# Patient Record
Sex: Female | Born: 1935 | Race: White | Hispanic: No | Smoking: Former smoker
Health system: Southern US, Community
[De-identification: ages and names within clinical notes are randomized; demographics above are authoritative.]

## PROBLEM LIST (undated history)

## (undated) DIAGNOSIS — I2510 Atherosclerotic heart disease of native coronary artery without angina pectoris: Secondary | ICD-10-CM

## (undated) DIAGNOSIS — I10 Essential (primary) hypertension: Secondary | ICD-10-CM

## (undated) DIAGNOSIS — I255 Ischemic cardiomyopathy: Secondary | ICD-10-CM

## (undated) DIAGNOSIS — I480 Paroxysmal atrial fibrillation: Secondary | ICD-10-CM

## (undated) DIAGNOSIS — K922 Gastrointestinal hemorrhage, unspecified: Secondary | ICD-10-CM

## (undated) DIAGNOSIS — C189 Malignant neoplasm of colon, unspecified: Secondary | ICD-10-CM

## (undated) DIAGNOSIS — D649 Anemia, unspecified: Secondary | ICD-10-CM

## (undated) DIAGNOSIS — N184 Chronic kidney disease, stage 4 (severe): Secondary | ICD-10-CM

## (undated) DIAGNOSIS — I5022 Chronic systolic (congestive) heart failure: Secondary | ICD-10-CM

## (undated) HISTORY — DX: Atherosclerotic heart disease of native coronary artery without angina pectoris: I25.10

## (undated) HISTORY — DX: Chronic kidney disease, stage 4 (severe): N18.4

## (undated) HISTORY — PX: COLECTOMY: SHX59

## (undated) HISTORY — DX: Paroxysmal atrial fibrillation: I48.0

## (undated) HISTORY — DX: Chronic systolic (congestive) heart failure: I50.22

---

## 2002-12-15 ENCOUNTER — Other Ambulatory Visit: Payer: Self-pay

## 2007-07-18 ENCOUNTER — Ambulatory Visit: Payer: Self-pay | Admitting: Gastroenterology

## 2007-08-16 ENCOUNTER — Other Ambulatory Visit: Payer: Self-pay

## 2007-08-16 ENCOUNTER — Ambulatory Visit: Payer: Self-pay | Admitting: Surgery

## 2007-08-22 ENCOUNTER — Inpatient Hospital Stay: Payer: Self-pay | Admitting: Surgery

## 2007-08-27 ENCOUNTER — Ambulatory Visit: Payer: Self-pay | Admitting: Oncology

## 2007-09-16 ENCOUNTER — Ambulatory Visit: Payer: Self-pay | Admitting: Oncology

## 2007-09-27 ENCOUNTER — Ambulatory Visit: Payer: Self-pay | Admitting: Oncology

## 2007-10-27 ENCOUNTER — Ambulatory Visit: Payer: Self-pay | Admitting: Oncology

## 2007-10-28 ENCOUNTER — Ambulatory Visit: Payer: Self-pay | Admitting: Oncology

## 2007-11-27 ENCOUNTER — Ambulatory Visit: Payer: Self-pay | Admitting: Oncology

## 2007-12-27 ENCOUNTER — Ambulatory Visit: Payer: Self-pay | Admitting: Oncology

## 2008-09-03 ENCOUNTER — Ambulatory Visit: Payer: Self-pay | Admitting: Gastroenterology

## 2009-05-26 ENCOUNTER — Ambulatory Visit: Payer: Self-pay | Admitting: Oncology

## 2009-06-14 ENCOUNTER — Ambulatory Visit: Payer: Self-pay | Admitting: Oncology

## 2009-06-26 ENCOUNTER — Ambulatory Visit: Payer: Self-pay | Admitting: Oncology

## 2009-07-26 ENCOUNTER — Ambulatory Visit: Payer: Self-pay | Admitting: Oncology

## 2009-08-16 LAB — CEA: CEA: 2.6 ng/mL

## 2009-08-26 ENCOUNTER — Ambulatory Visit: Payer: Self-pay | Admitting: Oncology

## 2009-09-26 ENCOUNTER — Ambulatory Visit: Payer: Self-pay | Admitting: Oncology

## 2009-10-02 ENCOUNTER — Ambulatory Visit: Payer: Self-pay | Admitting: Oncology

## 2009-10-26 ENCOUNTER — Ambulatory Visit: Payer: Self-pay | Admitting: Oncology

## 2009-12-12 ENCOUNTER — Ambulatory Visit: Payer: Self-pay | Admitting: Oncology

## 2009-12-14 LAB — CEA: CEA: 3.3 ng/mL (ref 0.0–4.7)

## 2009-12-26 ENCOUNTER — Ambulatory Visit: Payer: Self-pay | Admitting: Oncology

## 2010-04-11 ENCOUNTER — Inpatient Hospital Stay: Payer: Self-pay | Admitting: Internal Medicine

## 2010-07-07 ENCOUNTER — Ambulatory Visit: Payer: Self-pay | Admitting: Gastroenterology

## 2010-12-03 ENCOUNTER — Ambulatory Visit: Payer: Self-pay | Admitting: Oncology

## 2010-12-27 ENCOUNTER — Ambulatory Visit: Payer: Self-pay | Admitting: Oncology

## 2011-03-11 ENCOUNTER — Ambulatory Visit: Payer: Self-pay | Admitting: Oncology

## 2011-03-11 LAB — CBC CANCER CENTER
Basophil #: 0 x10 3/mm (ref 0.0–0.1)
Basophil %: 0.2 %
Eosinophil #: 0.2 x10 3/mm (ref 0.0–0.7)
HCT: 32.7 % — ABNORMAL LOW (ref 35.0–47.0)
HGB: 10.6 g/dL — ABNORMAL LOW (ref 12.0–16.0)
Lymphocyte %: 29.2 %
Monocyte %: 13.4 %
RBC: 3.9 10*6/uL (ref 3.80–5.20)
RDW: 13.3 % (ref 11.5–14.5)
WBC: 7.6 x10 3/mm (ref 3.6–11.0)

## 2011-03-11 LAB — IRON AND TIBC
Iron Bind.Cap.(Total): 243 ug/dL — ABNORMAL LOW (ref 250–450)
Iron Saturation: 31 %
Iron: 76 ug/dL (ref 50–170)

## 2011-03-11 LAB — FERRITIN: Ferritin (ARMC): 198 ng/mL (ref 8–388)

## 2011-03-12 LAB — CEA: CEA: 3 ng/mL (ref 0.0–4.7)

## 2011-03-27 ENCOUNTER — Ambulatory Visit: Payer: Self-pay | Admitting: Oncology

## 2011-06-10 ENCOUNTER — Ambulatory Visit: Payer: Self-pay | Admitting: Oncology

## 2011-06-10 LAB — CBC CANCER CENTER
Basophil %: 0.7 %
Eosinophil #: 0.2 x10 3/mm (ref 0.0–0.7)
Eosinophil %: 2.2 %
HCT: 33.2 % — ABNORMAL LOW (ref 35.0–47.0)
MCH: 27.6 pg (ref 26.0–34.0)

## 2011-06-27 ENCOUNTER — Ambulatory Visit: Payer: Self-pay | Admitting: Oncology

## 2011-07-18 ENCOUNTER — Inpatient Hospital Stay: Payer: Self-pay | Admitting: Internal Medicine

## 2011-07-18 LAB — COMPREHENSIVE METABOLIC PANEL
Albumin: 3 g/dL — ABNORMAL LOW (ref 3.4–5.0)
Alkaline Phosphatase: 68 U/L (ref 50–136)
Bilirubin,Total: 0.3 mg/dL (ref 0.2–1.0)
Co2: 24 mmol/L (ref 21–32)
Creatinine: 1.1 mg/dL (ref 0.60–1.30)
Glucose: 106 mg/dL — ABNORMAL HIGH (ref 65–99)
SGPT (ALT): 22 U/L
Sodium: 143 mmol/L (ref 136–145)

## 2011-07-18 LAB — URINALYSIS, COMPLETE
Bacteria: NONE SEEN
Bilirubin,UR: NEGATIVE
Protein: NEGATIVE
Specific Gravity: 1.009 (ref 1.003–1.030)

## 2011-07-18 LAB — CBC: Platelet: 170 10*3/uL (ref 150–440)

## 2011-07-18 LAB — PROTIME-INR: Prothrombin Time: 14.3 secs (ref 11.5–14.7)

## 2011-07-18 LAB — LIPASE, BLOOD: Lipase: 157 U/L (ref 73–393)

## 2011-07-18 LAB — HEMATOCRIT: HCT: 25.4 % — ABNORMAL LOW (ref 35.0–47.0)

## 2011-07-18 LAB — HEMOGLOBIN: HGB: 8.4 g/dL — ABNORMAL LOW (ref 12.0–16.0)

## 2011-07-19 LAB — BASIC METABOLIC PANEL
BUN: 13 mg/dL (ref 7–18)
Calcium, Total: 7.8 mg/dL — ABNORMAL LOW (ref 8.5–10.1)
Co2: 25 mmol/L (ref 21–32)
EGFR (African American): >60
Glucose: 74 mg/dL (ref 65–99)
Sodium: 145 mmol/L (ref 136–145)

## 2011-07-19 LAB — CBC WITH DIFFERENTIAL/PLATELET
Basophil #: 0 10*3/uL (ref 0.0–0.1)
Basophil %: 0.5 %
Eosinophil #: 0.1 10*3/uL (ref 0.0–0.7)
Eosinophil %: 2.4 %
HCT: 23.2 % — ABNORMAL LOW (ref 35.0–47.0)
HGB: 7.6 g/dL — ABNORMAL LOW (ref 12.0–16.0)
Lymphocyte #: 1.6 10*3/uL (ref 1.0–3.6)
MCH: 27.6 pg (ref 26.0–34.0)
MCHC: 32.5 g/dL (ref 32.0–36.0)
MCV: 85 fL (ref 80–100)
Neutrophil %: 53.6 %
Platelet: 154 10*3/uL (ref 150–440)
RBC: 2.74 10*6/uL — ABNORMAL LOW (ref 3.80–5.20)
WBC: 4.7 10*3/uL (ref 3.6–11.0)

## 2011-10-14 ENCOUNTER — Ambulatory Visit: Payer: Self-pay | Admitting: Oncology

## 2011-10-14 LAB — IRON AND TIBC
Iron Bind.Cap.(Total): 243 ug/dL — ABNORMAL LOW (ref 250–450)
Unbound Iron-Bind.Cap.: 163 ug/dL

## 2011-10-14 LAB — CBC CANCER CENTER
Basophil #: 0.1 x10 3/mm (ref 0.0–0.1)
Lymphocyte #: 2.6 x10 3/mm (ref 1.0–3.6)
MCH: 27.7 pg (ref 26.0–34.0)
MCV: 85 fL (ref 80–100)
Monocyte #: 0.8 x10 3/mm (ref 0.2–0.9)
Platelet: 189 x10 3/mm (ref 150–440)
RDW: 13 % (ref 11.5–14.5)

## 2011-10-14 LAB — FERRITIN: Ferritin (ARMC): 262 ng/mL (ref 8–388)

## 2011-10-27 ENCOUNTER — Ambulatory Visit: Payer: Self-pay | Admitting: Oncology

## 2012-01-16 ENCOUNTER — Inpatient Hospital Stay: Payer: Self-pay | Admitting: Student

## 2012-01-16 LAB — CK TOTAL AND CKMB (NOT AT ARMC)
CK, Total: 75 U/L (ref 21–215)
CK-MB: <0.5 ng/mL — ABNORMAL LOW (ref 0.5–3.6)

## 2012-01-16 LAB — COMPREHENSIVE METABOLIC PANEL
Albumin: 3.6 g/dL (ref 3.4–5.0)
Alkaline Phosphatase: 77 U/L (ref 50–136)
Bilirubin,Total: 0.4 mg/dL (ref 0.2–1.0)
Creatinine: 0.98 mg/dL (ref 0.60–1.30)
EGFR (Non-African Amer.): 53 — ABNORMAL LOW
Glucose: 95 mg/dL (ref 65–99)
Osmolality: 282 (ref 275–301)
SGPT (ALT): 17 U/L (ref 12–78)
Sodium: 141 mmol/L (ref 136–145)

## 2012-01-16 LAB — TROPONIN I
Troponin-I: 0.08 ng/mL — ABNORMAL HIGH
Troponin-I: 0.19 ng/mL — ABNORMAL HIGH

## 2012-01-17 ENCOUNTER — Encounter (HOSPITAL_COMMUNITY)
Admission: AD | Disposition: A | Discharge status: Home / Self Care | Payer: Self-pay | Source: Other Acute Inpatient Hospital | Attending: Cardiovascular Disease

## 2012-01-17 ENCOUNTER — Encounter (HOSPITAL_COMMUNITY): Payer: Self-pay | Admitting: Cardiology

## 2012-01-17 ENCOUNTER — Inpatient Hospital Stay (HOSPITAL_COMMUNITY)
Admission: AD | Admit: 2012-01-17 | Discharge: 2012-02-03 | DRG: 248 | Disposition: A | Discharge status: Home / Self Care | Payer: Medicare Other | Source: Other Acute Inpatient Hospital | Attending: Cardiovascular Disease | Admitting: Cardiovascular Disease

## 2012-01-17 DIAGNOSIS — J189 Pneumonia, unspecified organism: Secondary | ICD-10-CM | POA: Diagnosis not present

## 2012-01-17 DIAGNOSIS — I509 Heart failure, unspecified: Secondary | ICD-10-CM | POA: Diagnosis present

## 2012-01-17 DIAGNOSIS — I213 ST elevation (STEMI) myocardial infarction of unspecified site: Secondary | ICD-10-CM | POA: Diagnosis present

## 2012-01-17 DIAGNOSIS — N390 Urinary tract infection, site not specified: Secondary | ICD-10-CM | POA: Diagnosis present

## 2012-01-17 DIAGNOSIS — C189 Malignant neoplasm of colon, unspecified: Secondary | ICD-10-CM | POA: Insufficient documentation

## 2012-01-17 DIAGNOSIS — I4891 Unspecified atrial fibrillation: Secondary | ICD-10-CM | POA: Diagnosis present

## 2012-01-17 DIAGNOSIS — G9340 Encephalopathy, unspecified: Secondary | ICD-10-CM | POA: Diagnosis not present

## 2012-01-17 DIAGNOSIS — R079 Chest pain, unspecified: Secondary | ICD-10-CM

## 2012-01-17 DIAGNOSIS — I2109 ST elevation (STEMI) myocardial infarction involving other coronary artery of anterior wall: Secondary | ICD-10-CM | POA: Diagnosis present

## 2012-01-17 DIAGNOSIS — I214 Non-ST elevation (NSTEMI) myocardial infarction: Principal | ICD-10-CM | POA: Diagnosis present

## 2012-01-17 DIAGNOSIS — I5041 Acute combined systolic (congestive) and diastolic (congestive) heart failure: Secondary | ICD-10-CM | POA: Diagnosis present

## 2012-01-17 DIAGNOSIS — A498 Other bacterial infections of unspecified site: Secondary | ICD-10-CM | POA: Diagnosis present

## 2012-01-17 DIAGNOSIS — I10 Essential (primary) hypertension: Secondary | ICD-10-CM | POA: Diagnosis present

## 2012-01-17 DIAGNOSIS — D649 Anemia, unspecified: Secondary | ICD-10-CM | POA: Diagnosis present

## 2012-01-17 DIAGNOSIS — E785 Hyperlipidemia, unspecified: Secondary | ICD-10-CM | POA: Diagnosis present

## 2012-01-17 DIAGNOSIS — J9600 Acute respiratory failure, unspecified whether with hypoxia or hypercapnia: Secondary | ICD-10-CM | POA: Diagnosis not present

## 2012-01-17 DIAGNOSIS — N179 Acute kidney failure, unspecified: Secondary | ICD-10-CM | POA: Diagnosis not present

## 2012-01-17 DIAGNOSIS — R579 Shock, unspecified: Secondary | ICD-10-CM

## 2012-01-17 DIAGNOSIS — I219 Acute myocardial infarction, unspecified: Secondary | ICD-10-CM

## 2012-01-17 DIAGNOSIS — E876 Hypokalemia: Secondary | ICD-10-CM | POA: Diagnosis not present

## 2012-01-17 DIAGNOSIS — E2749 Other adrenocortical insufficiency: Secondary | ICD-10-CM | POA: Diagnosis present

## 2012-01-17 DIAGNOSIS — I5021 Acute systolic (congestive) heart failure: Secondary | ICD-10-CM

## 2012-01-17 DIAGNOSIS — R0602 Shortness of breath: Secondary | ICD-10-CM

## 2012-01-17 DIAGNOSIS — A419 Sepsis, unspecified organism: Secondary | ICD-10-CM | POA: Diagnosis not present

## 2012-01-17 DIAGNOSIS — Z955 Presence of coronary angioplasty implant and graft: Secondary | ICD-10-CM

## 2012-01-17 DIAGNOSIS — K922 Gastrointestinal hemorrhage, unspecified: Secondary | ICD-10-CM | POA: Diagnosis present

## 2012-01-17 HISTORY — DX: Malignant neoplasm of colon, unspecified: C18.9

## 2012-01-17 HISTORY — DX: Gastrointestinal hemorrhage, unspecified: K92.2

## 2012-01-17 HISTORY — PX: LEFT HEART CATHETERIZATION WITH CORONARY ANGIOGRAM: SHX5451

## 2012-01-17 HISTORY — DX: Anemia, unspecified: D64.9

## 2012-01-17 HISTORY — DX: Ischemic cardiomyopathy: I25.5

## 2012-01-17 HISTORY — DX: Essential (primary) hypertension: I10

## 2012-01-17 LAB — LIPID PANEL
Cholesterol: 180 mg/dL (ref 0–200)
HDL Cholesterol: 39 mg/dL — ABNORMAL LOW (ref 40–60)
Ldl Cholesterol, Calc: 120 mg/dL — ABNORMAL HIGH (ref 0–100)
VLDL Cholesterol, Calc: 21 mg/dL (ref 5–40)

## 2012-01-17 LAB — BASIC METABOLIC PANEL
CO2: 21 mEq/L (ref 19–32)
Calcium: 8.5 mg/dL (ref 8.4–10.5)
Chloride: 102 mEq/L (ref 96–112)
Creatinine, Ser: 1.12 mg/dL — ABNORMAL HIGH (ref 0.50–1.10)
Glucose, Bld: 194 mg/dL — ABNORMAL HIGH (ref 70–99)

## 2012-01-17 LAB — URINALYSIS, ROUTINE W REFLEX MICROSCOPIC
Bilirubin Urine: NEGATIVE
Glucose, UA: NEGATIVE mg/dL
Ketones, ur: NEGATIVE mg/dL
Protein, ur: NEGATIVE mg/dL
pH: 5 (ref 5.0–8.0)

## 2012-01-17 LAB — CBC
Hemoglobin: 11 g/dL — ABNORMAL LOW (ref 12.0–15.0)
MCH: 27 pg (ref 26.0–34.0)
MCV: 83.5 fL (ref 78.0–100.0)
Platelets: 214 10*3/uL (ref 150–400)
RBC: 4.07 MIL/uL (ref 3.87–5.11)
WBC: 11.2 10*3/uL — ABNORMAL HIGH (ref 4.0–10.5)

## 2012-01-17 LAB — TROPONIN I: Troponin I: >20 ng/mL (ref <0.30)

## 2012-01-17 LAB — URINE MICROSCOPIC-ADD ON

## 2012-01-17 SURGERY — LEFT HEART CATHETERIZATION WITH CORONARY ANGIOGRAM
Anesthesia: LOCAL

## 2012-01-17 MED ORDER — CLOPIDOGREL BISULFATE 300 MG PO TABS
600.0000 mg | ORAL_TABLET | Freq: Once | ORAL | Status: AC
  Administered 2012-01-17: 600 mg via ORAL
  Filled 2012-01-17: qty 2

## 2012-01-17 MED ORDER — ATROPINE SULFATE 1 MG/ML IJ SOLN
INTRAMUSCULAR | Status: AC
  Filled 2012-01-17: qty 1

## 2012-01-17 MED ORDER — BIVALIRUDIN 250 MG IV SOLR
INTRAVENOUS | Status: AC
  Filled 2012-01-17: qty 250

## 2012-01-17 MED ORDER — PANTOPRAZOLE SODIUM 40 MG PO TBEC
40.0000 mg | DELAYED_RELEASE_TABLET | Freq: Every day | ORAL | Status: DC
  Administered 2012-01-17 – 2012-01-18 (×2): 40 mg via ORAL
  Filled 2012-01-17 (×2): qty 1

## 2012-01-17 MED ORDER — SODIUM CHLORIDE 0.9 % IV SOLN
1.0000 mL/kg/h | INTRAVENOUS | Status: AC
  Administered 2012-01-17: 1 mL/kg/h via INTRAVENOUS

## 2012-01-17 MED ORDER — CLOPIDOGREL BISULFATE 75 MG PO TABS
75.0000 mg | ORAL_TABLET | Freq: Every day | ORAL | Status: DC
  Administered 2012-01-18 – 2012-02-03 (×17): 75 mg via ORAL
  Filled 2012-01-17 (×19): qty 1

## 2012-01-17 MED ORDER — PROMETHAZINE HCL 25 MG/ML IJ SOLN
INTRAMUSCULAR | Status: AC
  Filled 2012-01-17: qty 1

## 2012-01-17 MED ORDER — EPTIFIBATIDE 75 MG/100ML IV SOLN
1.0000 ug/kg/min | INTRAVENOUS | Status: DC
  Administered 2012-01-17 – 2012-01-18 (×2): 1 ug/kg/min via INTRAVENOUS
  Filled 2012-01-17: qty 100

## 2012-01-17 MED ORDER — POTASSIUM CHLORIDE CRYS ER 20 MEQ PO TBCR
40.0000 meq | EXTENDED_RELEASE_TABLET | Freq: Once | ORAL | Status: AC
  Administered 2012-01-18: 40 meq via ORAL
  Filled 2012-01-17: qty 2

## 2012-01-17 MED ORDER — POTASSIUM CHLORIDE 10 MEQ/100ML IV SOLN
10.0000 meq | INTRAVENOUS | Status: AC
  Administered 2012-01-17 (×2): 10 meq via INTRAVENOUS
  Filled 2012-01-17: qty 200

## 2012-01-17 MED ORDER — ONDANSETRON HCL 4 MG/2ML IJ SOLN
4.0000 mg | Freq: Four times a day (QID) | INTRAMUSCULAR | Status: DC | PRN
  Administered 2012-01-17 – 2012-01-18 (×2): 4 mg via INTRAVENOUS
  Filled 2012-01-17 (×4): qty 2

## 2012-01-17 MED ORDER — PROMETHAZINE HCL 25 MG/ML IJ SOLN: 12.5000 mg | INTRAMUSCULAR | Status: DC

## 2012-01-17 MED ORDER — EPTIFIBATIDE 75 MG/100ML IV SOLN
INTRAVENOUS | Status: AC
  Filled 2012-01-17: qty 100

## 2012-01-17 MED ORDER — ACETAMINOPHEN 325 MG PO TABS
650.0000 mg | ORAL_TABLET | ORAL | Status: DC | PRN
  Administered 2012-01-18: 650 mg via ORAL
  Filled 2012-01-17: qty 2

## 2012-01-17 MED ORDER — METOPROLOL TARTRATE 25 MG PO TABS
25.0000 mg | ORAL_TABLET | Freq: Two times a day (BID) | ORAL | Status: DC
  Administered 2012-01-17 – 2012-01-18 (×2): 25 mg via ORAL
  Filled 2012-01-17 (×3): qty 1

## 2012-01-17 NOTE — CV Procedure (Addendum)
[facility] CARDIAC CATHETERIZATION AND PERCUTANEOUS CORONARY INTERVENTION REPORT  NAME:  [name] Green   MRN: [id number] DOB:  [birth year]   ADMIT DATE: 01/17/2012 Procedure Date: 01/17/2012   INTERVENTIONAL CARDIOLOGIST: Marykay Lex, M.D., MS PRIMARY CARE PROVIDER: No primary provider on file. PRIMARY CARDIOLOGIST: Dr. Milinda Antis, [facility]  PATIENT:  [name] Green is a 78 y.o. female the past history notable for colon cancer in remission, history of GI bleeds on as well as hypertension who admitted to [facility] on the evening of December 21 with dyspnea. This occurred while shopping. She then upon arriving back home this ride having symptoms of discomfort in her chest radiating to her shoulder associated with sweating. She was admitted to [facility] by the hospitalist service and was found to have mildly elevated troponins. She was admitted for presumed non-ST elevation MI. However this morning upon initial valuation she had recurrence of chest pain with an ECG demonstrating 1-2 mm ST elevations in the anterior septal/anterior lateral distribution involving all the precordial leads as well as leads 2 and aVF.  She was evaluated by Dr. Kary Kos who felt that she warranted emergent transfer to [facility] for Anterior STEMI.  He did discuss the risks benefits alternatives and indications of the cardiac catheterization procedure. She agreed to proceed prior to transport. She did get mildly hypotensive after demonstration of nitroglycerin sublingual, therefore she was started on low-dose dopamine for blood pressure assistance.  Due to history of gastrointestinal bleeding in the past, Dr. Kary Kos requested the use of bare-metal stents to avoid the need for long-term dual antiplatelet therapy. Upon arrival to [facility], she was brought directly to the catheterization lab where she continued to have nausea despite receiving 8 mg of Zofran at  [facility]. Her blood pressures were stable in the 110-115 mmHg range, with heart rates in the 110-120 beats per minute.  PRE-OPERATIVE DIAGNOSIS:    Anterior ST Elevation MI  PROCEDURES PERFORMED:    Left Heart Catheterization with Native Coronary Angiography  Left Ventriculography  Successful 2 site Percutaneous Coronary Intervention of mid and proximal LAD lesions using overlapping Veriflex Bare-Metal Stents (BMS) -- 2.5 mm x 20 mm mid - post dilated to 2.75  - 2.4mm, and 2.75 mm x 24 mm proximal -- postdilated to 3.0 mm  PROCEDURE: Consent: The procedure had been explained to the patient by Dr. Kary Kos while at [facility]. She had voice understanding and agree to proceed prior to transport.  Risks of procedure as well as the alternatives and risks of each were explained to the (patient/caregiver).  Consent for procedure obtained. and Unable to obtain consent because of emergent medical necessity.  PROCEDURE: The patient was brought to the 2nd Floor  Cardiac Catheterization Lab in the fasting state and prepped and draped in the usual sterile fashion for Right groin access. Sterile technique was used including antiseptics, cap, gloves, gown, hand hygiene, mask and sheet.  Skin prep: Chlorhexidine.  Time Out: Verified patient identification, verified procedure, site/side was marked, verified correct patient position, special equipment/implants available, medications/allergies/relevent history reviewed, required imaging and test results available.  Performed  Access: Right Common Femoral Artery; 6 Fr Sheath - fluoroscopically guided modified Seldinger technique. Diagnostic:  6 French catheters advanced over and exchanged over the J-wire  Right  Coronary Artery Angiography: JR 4  Left Coronary Artery Angiography: XB LAD 3.5  LV Hemodynamics (LV Gram): JR 4 (initial), angled pigtail (post PCI, for LV gram)  At the  completion of the procedure catheters were removed out of  the body over wire without complication. The sheath was sutured in place.  ANESTHESIA:   Local Lidocaine 18 ml SEDATION:  12.5 mg IV Phenergan MEDICATIONS:  Plavix 600 mg not administered due to Nausea/ Emesis pre, during & post procedure.  IV Pepcid 20mg ;   IV Lasix 20 mg  Dopamine drip weaned to off from 68mcg/min;  Omnipaque Contrast ~205 ml.  Anticoagulation: Angiomax Bolus with drip during PCI.  Anti-Platelet Agent: Integrelin Bolus - reduced rate; to be continued up to 6hr post 600mg  Plavix  Hemodynamics:  Central Aortic / Mean Pressures:  Initial -- 116/66 mmHg; 87 mmHg on 27mcg/min Dopamine; HR 115-120 bpm  Final (Post PCI)-- 121/61 mmHg; 23-28 mmHg; HR 100-105  Left Ventricular Pressures / EDP:   Initial -- 116/53mmHg; 25 mmHg  Final (Post PCI) -- 114/27 mmHg; 30 mmHg --> IV Lasix 20mg  given.  Coronary Anatomy:  Left Main: Calcified ~20% proximal stenosis; bifurcates into LAD & Circumflex with very small Ramus Intermedius. LAD: Large caliber vessel with an early SP1/Diag 1 (D1) branch point that has a ~90% focal lesion at the branch point, followed by a ~60% stenosis (Lesion #2 - Proximal LAD) just after D1; there is a brief intervening segment that is somewhat irregular followed by a second branch point with a smaller Diag 2 but major SP1 (branching trunk) that appears to be ulcerated ~80-90% stenosed in a (1,1,0) bifurcation lesion (Lesion #1 - mid LAD) extending several mm beyond Diag2 (D2).  The remainder of the vessel tapers to a moderate caliber vessel and wraps the apex perfusing the distal ~1/3 of the inferoapical wall.  D1: Small caliber, yet important branch with minimal ostial involvement in the bifurcation lesion; TIMI 3 flow.  Otherwise angiographically normal.  D2: Very small caliber vessel with ~20% ostial stenosis as part of the bifurcation lesion; TIMI3 flow and otherwise angiographically normal Left Circumflex: Large caliber, non-dominant vessel with 1  small OM branch in the proximal segment; the vessel bifurcates in the AV Groove into a Large Caliber Lateral OM1 and the terminal AV Groove branch that trifurcates into 3 small posterolateral branches.  Minimal luminal irregularities.  OM1: Large Lateral branch that gives off a small "OM2" before terminating with 3 small branches almost reaching the apex. Minimal luminal irregularities. Ramus intermedius: Very small caliber vessel; Minimal luminal irregularities.  RCA: Large caliber dominant vessel that essentially terminates a moderate to large caliber Posterior Descending Artery (reaching ~2/3 of the way to the apex) giving rise to a small  Right Posterior AV Groove Branch with several small posterolateral branches and the AV Nodal Artery.  At least 3 small caliber RV Marginal branches from the mid vessel.  Minimal luminal irregularities.  Left Ventriculography: Post PCI.  EF: ~25-30 %  Wall Motion: Severe hypokinesis of the entire Anterior - Anterolateral & inferoapical walls perfused by LAD.  Percutaneous Coronary Intervention:  Complicated 2 site bifurcation PCI of 2 LAD Lesions in the setting of mild Cardiogenic Shock.  Guide: 6 Fr  XB LAD 3.5 Guidewire:  BMW  Lesion #1: mid LAD 80-90% ulcerated bifurcation lesion.  Predilation Balloon: Sprinter Legend 2.0 mm x 12 mm -- 8 Atm x 40 Sec  Same balloon used for predilation of the Lesion #2 prior to stent placement.  Stent #1 - Lesion #1: Veriflex BMS 2.5 mm x 20 mm -- deployed at 10 Atm x 30 Sec,  Distal post-dilation with stent balloon: 12 Atm x 45  Sec -- final diameter 2.65 mm distal Lesion #2: Proximal LAD 90% followed by 60% bifurcation lesion. -- Due to very short distance between the lesions, it was felt best to overlap stents as opposed to leave and intervening unstented segment.  Predilation Balloon: Sprinter Legend 2.0 mm x 12 mm -- 8 Atm x 30 Sec  Stent #2:  Veriflex BMS 2.75 mm x 24 mm -- deployed at 10 Atm x 30  Sec  Overlapping site post dilation with stent balloon extending to the distal portion of stent 1: 14 Atm x 45 Sec -- making distal stent diameter 2.75 with resection of the terminal portion.  Post-dilation Balloon of proximal portion of stent 1: [place] Quantum Apex 3.0 mm x 20 mm ;   18 Atm x 60 Sec -- Final Diameter 3.05 millimeter  6 Fr RFA Sheat:  Sutured in place, no hematoma.  To be removed ~4 hr post Angiomax infusion discontinued.    EBL:   < 10 ml  PATIENT DISPOSITION:    The patient was transferred to the PACU holding area in a hemodynamicaly stable, chest pain free condition.    Her blood pressures were in the 120/60 mmHg range with heart rates in the low 100s. She was weaned off nonrebreather with oxygen saturations in high 90s. Her nausea was relatively well controlled but not sufficient enough to administer Plavix  The patient tolerated the procedure well, and there were no complications.  The patient was stable before, during, and after the procedure.  POST-OPERATIVE DIAGNOSIS:    Severe 2 site bifurcation LAD stenoses involving D1 and D2. A likely culprit for anterior elevations was the mid LAD lesion given the ulceration.  Otherwise minimal luminal irregularities throughout the remaining coronary arteries.  Successful to site of PCI of the mid and proximal LAD as described.  Bare-metal stents used due to history of GI bleeding, per request.  Severely reduced ventricular ejection fraction with expected LAD distribution wall motion abnormality.  Significant elevated LVEDP post-PCI -- she did have diuresis following 20 mg IV Lasix given prior to LV gram.  PLAN OF CARE:  Transfer to CCU for ongoing care. I discussed the case with Dr. Myrtis Ser from the [facility] who will assume care.  As the patient was unable to take Plavix, we'll continue Integrilin infusion 4-6 hours beyond the time that she is able to successfully take Plavix 600 mg load.  We'll delay sheath  removal by 2 hours due to ACT of greater than 500 and Cath Lab.  Restart home dose beta blocker this evening.  Would reevaluate her respiratory status, as she may require additional doses of IV Lasix due to elevated EDP.  PPI ordered.  2-D echocardiogram with contrast ordered for A.M.  The patient has been admitted to [facility]:   Admitting Attending Dr. Kary Kos;   Attending Physician Dr. Linde Gillis, M.D., M.S. [facility] [address] [place], [place]  [address]  [phone]  01/17/2012 2:10 PM

## 2012-01-17 NOTE — H&P (Signed)
History and Physical Interval Note:  NAME:  [name] Green   MRN: [id number] DOB:  March 02, 1925   ADMIT DATE: 01/17/2012   01/17/2012 3:11 PM  [name] Green is a 78 y.o. female with a past medical history notable low presented to [facility] initially with sinus and is consistent with non-ST elevation MI with no significant EKG changes. However this morning, her symptoms progressed with increased chest discomfort. She was evaluated by Dr. Kary Kos from [facility] who evaluated her ECG noting 1-2 mm ST elevations in the entire precordial and lateral limb leads. She was therefore referred for transfer to [facility] for emergent cardiac catheterization.  She did get mildly hypotensive after demonstration of nitroglycerin sublingual, therefore she was started on low-dose dopamine for blood pressure assistance.  As she has had a history of significant GI bleeding in the past, the recommendation was made for bare-metal stents to be used to avoid prolonged dual antiplatelet therapy. Upon arrival to [facility], she was brought directly to the catheterization lab where she continued to have nausea despite receiving 8 mg of Zofran at [facility]. Her blood pressures were stable in the 110-115 mmHg range, with heart rates in the 110-120 beats per minute.  Past Medical History  Diagnosis Date  . Colon cancer     Remission, 2013  (Resection 2009)      . Hypertension   . GI bleed     Likely due to diverticular bleed, June, 2013  . Chronic anemia   . ST elevation MI (STEMI) - Anterior   01/17/2012        No past surgical history on file.  FAMHx: Noncontributory on review due to age  SOCHx: She lives with her son and daughter-in-law. She does not smoke and does not drink.  ALLERGIES: Allergies not on file  HOME MEDICATIONS: Metoprolol 25 mg PO BID Omeprazole 20mg  PO BID ASA 81 mg PO Daily  PHYSICAL EXAM:Blood pressure 105/47, pulse 110-120, resp. rate 22, height 5' (1.524  m), weight 63.957 kg (141 lb), SpO2 94.00% on NRB mask. General appearance: alert, cooperative, appears stated age, moderate distress, pale and syndromic appearance - consistent with Anterior STEMI Neck: no adenopathy, no carotid bruit, no JVD and supple, symmetrical, trachea midline Lungs: clear to auscultation bilaterally, normal percussion bilaterally and non-labored Heart: regular rate and rhythm, S1, S2 normal, no murmur, click, rub or gallop and Actually tachycardic Abdomen: soft, non-tender; bowel sounds normal; no masses,  no organomegaly Extremities: extremities normal, atraumatic, no cyanosis or edema Pulses: 1+ & thready Pedal pulses bilaterally  Neurologic: Alert and oriented X 3, normal strength and tone. Normal symmetric reflexes. Normal coordination and gait Cranial nerves: normal   IMPRESSION & PLAN The patients' history has been reviewed, patient examined, no change in status from most recent note, stable for surgery. I have reviewed the patients' chart and labs. Questions were answered to the patient's satisfaction.    [name] Green has presented today for surgery, with the diagnosis of Anterior STEMI. The various methods of treatment have been discussed with the patient and family.  Emergent Verbal Consent was obtained.  Risks / Complications include, but not limited to: Death, MI, CVA/TIA, VF/VT (with defibrillation), Bradycardia (need for temporary pacer placement), contrast induced nephropathy, bleeding / bruising / hematoma / pseudoaneurysm, vascular or coronary injury (with possible emergent CT or Vascular Surgery), adverse medication reactions, infection.    After consideration of risks, benefits and other options for treatment, the patient has consented to Procedure(s):  LEFT HEART CATHETERIZATION AND CORONARY ANGIOGRAPHY +/- AD HOC PERCUTANEOUS CORONARY INTERVENTION  as a surgical intervention.   We will proceed with the planned procedure.   [name]  W [facility] [address] [place], [place]  [address]  [phone]  01/17/2012 11:30 AM

## 2012-01-17 NOTE — H&P (Signed)
Physician H & P  Patient ID: [name] Green MRN: [id number] DOB/AGE: 09/23/1925 78 y.o. Admit date: 01/17/2012  Primary Care Physician:No primary provider on file. Primary Cardiologist   Arida  HPI:  The patient was transferred with an ST elevation MI. She had some chest symptoms yesterday and was taken into the [facility] emergency room. She appeared stable through the night in the emergency room. A two-dimensional echo was done at approximately 9 AM today. The report shows good left ventricular function. After that time the patient went to the bathroom and when she came back she had recurring significant symptoms. EKG shows ST elevation of an acute anterolateral MI. She was immediately transferred. She has been in the cath lab and had an excellent procedure by Dr. Herbie Baltimore. She has received multiple stents to her LAD and she is doing much better. The cath does show significant anterior wall motion abnormality. This is certainly related to the acute event since we know she had good LV function at 9:00 this morning. Her LVDP was high in the cath lab. She is responding to diuretics. She had significant nausea and this is improved.  No past medical history on file.  See the complete problem list below. No family history on file.  History   Social History  . Marital Status: Unknown    Spouse Name: N/A    Number of Children: N/A  . Years of Education: N/A   Occupational History  . Not on file.   Social History Main Topics  . Smoking status: Not on file  . Smokeless tobacco: Not on file  . Alcohol Use: Not on file  . Drug Use: Not on file  . Sexually Active: Not on file   Other Topics Concern  . Not on file   Social History Narrative  . No narrative on file    No past surgical history on file.   No prescriptions prior to admission    Review of systems:   Patient denies fever, chills, headache, sweats, rash, change in vision, change in hearing, chest pain, cough, nausea vomiting,  urinary symptoms. All other systems are reviewed and are negative.  Physical Exam: Height 5' (1.524 m), weight 141 lb (63.957 kg).   Patient is now resting comfortably. There is no jugulovenous distention. Lungs reveal a few scattered rhonchi. Cardiac exam reveals S1 and S2. There no clicks or significant murmurs. The abdomen is soft. There is no peripheral edema.  Labs:     Initial labs were done [facility]. BUN and creatinine were normal. Potassium was normal. Chest x-ray had shown no acute disease. Initial troponin was 0.19.  Radiology:     Chest x-ray done at [facility] showed no acute disease on January 16, 2012  EKG:   I have the EKG from 11:08 a.m. Today. This was the EKG done immediately after the patient had marked symptoms. There is 2 mm of ST elevation in V1 to V6. There is ST depression in leads 2,3 and aVF. This tracing certainly is consistent with an acute MI.  ASSESSMENT AND PLAN:    Colon cancer    There is a history of colon cancer. The patient had surgery in 2009. She is in remission.   Hypertension   GI bleed    The patient had a GI bleed in June, 2013. It was related to diverticular bleed.   Chronic anemia   ST elevation MI (STEMI)     The patient had chest symptoms with slight troponin  elevation on January 16, 2012. She was still in the emergency room at [facility] on the morning of January 17, 2012.An echo at 9 AM revealed good LV function. However the patient had acute severe pain at 11 AM. EKG revealed acute ST elevation compatible with STEMI. She was transported and has undergone emergent stenting to the LAD. Because the patient had a recent GI bleed she was treated with bare-metal stents. With her age and all other factors Plavix will be used along with aspirin. She was nauseated in the cath lab and was unable to take the Plavix. Therefore she is receiving IV Integrilin in the unit immediately post PCI. When her nausea is better and she is able to  take Plavix, her Integrilin will be stopped and the sheath pulled at the appropriate time.   Signed: Willa Rough, MD

## 2012-01-17 NOTE — Progress Notes (Signed)
Chaplain responded to STEMI page for pt coming from [facility] directly to Cath Lab. Chaplain located pt's family in ED waiting area and accompanied them to consult room outside Cath Lab. I visited with them and we had prayer together. Coordinated with cath lab regarding progress of the procedure. Afterwards physician reported to family and I then took them to 2900 waiting area. Helped family find Lance Muss while pt was being situated in 2908.

## 2012-01-18 ENCOUNTER — Inpatient Hospital Stay (HOSPITAL_COMMUNITY): Payer: Medicare Other

## 2012-01-18 ENCOUNTER — Encounter (HOSPITAL_COMMUNITY)
Admission: AD | Disposition: A | Discharge status: Home / Self Care | Payer: Self-pay | Source: Other Acute Inpatient Hospital | Attending: Cardiovascular Disease

## 2012-01-18 ENCOUNTER — Encounter (HOSPITAL_COMMUNITY): Payer: Self-pay | Admitting: Cardiology

## 2012-01-18 ENCOUNTER — Encounter: Payer: Self-pay | Admitting: *Deleted

## 2012-01-18 DIAGNOSIS — I059 Rheumatic mitral valve disease, unspecified: Secondary | ICD-10-CM

## 2012-01-18 DIAGNOSIS — I2510 Atherosclerotic heart disease of native coronary artery without angina pectoris: Secondary | ICD-10-CM

## 2012-01-18 DIAGNOSIS — I214 Non-ST elevation (NSTEMI) myocardial infarction: Secondary | ICD-10-CM | POA: Diagnosis not present

## 2012-01-18 HISTORY — PX: LEFT HEART CATHETERIZATION WITH CORONARY ANGIOGRAM: SHX5451

## 2012-01-18 LAB — BASIC METABOLIC PANEL
BUN: 18 mg/dL (ref 6–23)
CO2: 20 mEq/L (ref 19–32)
Calcium: 8.6 mg/dL (ref 8.4–10.5)
Chloride: 102 mEq/L (ref 96–112)
Creatinine, Ser: 1.42 mg/dL — ABNORMAL HIGH (ref 0.50–1.10)

## 2012-01-18 LAB — CBC
HCT: 31 % — ABNORMAL LOW (ref 36.0–46.0)
HCT: 30.5 % — ABNORMAL LOW (ref 36.0–46.0)
Hemoglobin: 10.1 g/dL — ABNORMAL LOW (ref 12.0–15.0)
MCH: 27.3 pg (ref 26.0–34.0)
MCH: 27.3 pg (ref 26.0–34.0)
MCH: 27.5 pg (ref 26.0–34.0)
MCHC: 32.6 g/dL (ref 30.0–36.0)
MCHC: 33.1 g/dL (ref 30.0–36.0)
MCV: 83.8 fL (ref 78.0–100.0)
MCV: 83.9 fL (ref 78.0–100.0)
MCV: 83.1 fL (ref 78.0–100.0)
Platelets: 201 10*3/uL (ref 150–400)
Platelets: 179 10*3/uL (ref 150–400)
RBC: 3.7 MIL/uL — ABNORMAL LOW (ref 3.87–5.11)
RBC: 3.67 MIL/uL — ABNORMAL LOW (ref 3.87–5.11)
RDW: 13.9 % (ref 11.5–15.5)
RDW: 14 % (ref 11.5–15.5)
RDW: 14.2 % (ref 11.5–15.5)
WBC: 9.8 10*3/uL (ref 4.0–10.5)
WBC: 13.3 10*3/uL — ABNORMAL HIGH (ref 4.0–10.5)
WBC: 17.9 10*3/uL — ABNORMAL HIGH (ref 4.0–10.5)

## 2012-01-18 LAB — CREATININE, SERUM
Creatinine, Ser: 1.42 mg/dL — ABNORMAL HIGH (ref 0.50–1.10)
GFR calc Af Amer: 38 mL/min — ABNORMAL LOW (ref >90)
GFR calc non Af Amer: 33 mL/min — ABNORMAL LOW (ref >90)

## 2012-01-18 LAB — POCT I-STAT 3, ART BLOOD GAS (G3+)
Acid-base deficit: 6 mmol/L — ABNORMAL HIGH (ref 0.0–2.0)
O2 Saturation: 88 %
TCO2: 20 mmol/L (ref 0–100)
pCO2 arterial: 36.1 mmHg (ref 35.0–45.0)
pO2, Arterial: 57 mmHg — ABNORMAL LOW (ref 80.0–100.0)

## 2012-01-18 LAB — POCT ACTIVATED CLOTTING TIME: Activated Clotting Time: 503 seconds

## 2012-01-18 SURGERY — LEFT HEART CATHETERIZATION WITH CORONARY ANGIOGRAM
Anesthesia: LOCAL

## 2012-01-18 MED ORDER — SODIUM CHLORIDE 0.9 % IV SOLN: INTRAVENOUS | Status: DC

## 2012-01-18 MED ORDER — PERFLUTREN LIPID MICROSPHERE
1.0000 mL | INTRAVENOUS | Status: AC | PRN
  Administered 2012-01-18: 2 mL via INTRAVENOUS
  Filled 2012-01-18: qty 10

## 2012-01-18 MED ORDER — PERFLUTREN LIPID MICROSPHERE
INTRAVENOUS | Status: AC
  Filled 2012-01-18: qty 10

## 2012-01-18 MED ORDER — MORPHINE SULFATE 2 MG/ML IJ SOLN
INTRAMUSCULAR | Status: AC
  Administered 2012-01-18: 1 mg via INTRAMUSCULAR
  Filled 2012-01-18: qty 1

## 2012-01-18 MED ORDER — LIDOCAINE HCL (PF) 1 % IJ SOLN
INTRAMUSCULAR | Status: AC
  Filled 2012-01-18: qty 30

## 2012-01-18 MED ORDER — MORPHINE SULFATE 2 MG/ML IJ SOLN
1.0000 mg | INTRAMUSCULAR | Status: DC | PRN
  Administered 2012-01-18: 1 mg via INTRAVENOUS
  Filled 2012-01-18: qty 1

## 2012-01-18 MED ORDER — METOPROLOL TARTRATE 25 MG PO TABS: 25.0000 mg | ORAL_TABLET | Freq: Two times a day (BID) | ORAL | Status: DC

## 2012-01-18 MED ORDER — ASPIRIN 81 MG PO CHEW
81.0000 mg | CHEWABLE_TABLET | Freq: Every day | ORAL | Status: DC
  Administered 2012-01-18 – 2012-02-03 (×17): 81 mg via ORAL
  Filled 2012-01-18 (×17): qty 1

## 2012-01-18 MED ORDER — SODIUM CHLORIDE 0.9 % IV SOLN: INTRAVENOUS | Status: AC

## 2012-01-18 MED ORDER — NITROGLYCERIN 0.2 MG/ML ON CALL CATH LAB
INTRAVENOUS | Status: AC
  Filled 2012-01-18: qty 1

## 2012-01-18 MED ORDER — ENOXAPARIN SODIUM 40 MG/0.4ML ~~LOC~~ SOLN
40.0000 mg | SUBCUTANEOUS | Status: DC
  Administered 2012-01-19: 40 mg via SUBCUTANEOUS
  Filled 2012-01-18: qty 0.4

## 2012-01-18 MED ORDER — METOPROLOL TARTRATE 1 MG/ML IV SOLN
INTRAVENOUS | Status: AC
  Filled 2012-01-18: qty 5

## 2012-01-18 MED ORDER — ACETAMINOPHEN 325 MG PO TABS
650.0000 mg | ORAL_TABLET | ORAL | Status: DC | PRN
  Administered 2012-01-18 – 2012-01-21 (×2): 650 mg via ORAL
  Filled 2012-01-18 (×3): qty 2

## 2012-01-18 MED ORDER — ASPIRIN 81 MG PO CHEW: 81.0000 mg | CHEWABLE_TABLET | Freq: Every day | ORAL | Status: DC

## 2012-01-18 MED ORDER — HEPARIN (PORCINE) IN NACL 2-0.9 UNIT/ML-% IJ SOLN
INTRAMUSCULAR | Status: AC
  Filled 2012-01-18: qty 1000

## 2012-01-18 MED ORDER — ONDANSETRON HCL 4 MG/2ML IJ SOLN
4.0000 mg | Freq: Four times a day (QID) | INTRAMUSCULAR | Status: DC | PRN
  Administered 2012-01-19 – 2012-01-28 (×8): 4 mg via INTRAVENOUS
  Filled 2012-01-18 (×10): qty 2

## 2012-01-18 MED FILL — Furosemide Inj 10 MG/ML: INTRAMUSCULAR | Qty: 4 | Status: AC

## 2012-01-18 MED FILL — Heparin Sodium (Porcine) 2 Unit/ML in Sodium Chloride 0.9%: INTRAMUSCULAR | Qty: 500 | Status: AC

## 2012-01-18 MED FILL — Dextrose Inj 5%: INTRAVENOUS | Qty: 50 | Status: AC

## 2012-01-18 MED FILL — Lidocaine HCl Local Preservative Free (PF) Inj 1%: INTRAMUSCULAR | Qty: 30 | Status: AC

## 2012-01-18 MED FILL — Famotidine in NaCl 0.9% IV Soln 20 MG/50ML: INTRAVENOUS | Qty: 50 | Status: AC

## 2012-01-18 MED FILL — Nitroglycerin IV Soln 200 MCG/ML in D5W: INTRAVENOUS | Qty: 1 | Status: AC

## 2012-01-18 NOTE — Care Management Note (Signed)
    Page 1 of 1   01/18/2012     3:02:05 PM   CARE MANAGEMENT NOTE 01/18/2012  Patient:  [name] [name]   Account Number:  [id number]  Date Initiated:  01/18/2012  Documentation initiated by:  Junius Creamer  Subjective/Objective Assessment:   adm w mi     Action/Plan:   lives  w family   Anticipated DC Date:     Anticipated DC Plan:        DC Planning Services  CM consult      Choice offered to / List presented to:             Status of service:   Medicare Important Message given?   (If response is "NO", the following Medicare IM given date fields will be blank) Date Medicare IM given:   Date Additional Medicare IM given:    Discharge Disposition:    Per UR Regulation:  Reviewed for med. necessity/level of care/duration of stay  If discussed at Long Length of Stay Meetings, dates discussed:    Comments:  12/23 1500 debbie [name] Digangi rn, bsn [phone]

## 2012-01-18 NOTE — Progress Notes (Signed)
Echocardiogram 2D Echocardiogram with Definity has been performed.  [name] [name] 01/18/2012, 1:10 PM

## 2012-01-18 NOTE — H&P (View-Only) (Signed)
Patient seen and evaluated twice.  ECG was not much changed from tracing earlier this am without pain.  However, she continues to have arm and shoulder pain that has not responded to conservative measures.    I had reviewed films and the final result was excellent, and I discussed with Dr. Harding.  Will take back to the lab for relook angio.  Given ECG changes and echo suspect she has old ASMI.  Discussed with son and I also reviewed the films again with him.   

## 2012-01-18 NOTE — Interval H&P Note (Signed)
History and Physical Interval Note:  01/18/2012 1:11 PM  [name] [name]  has presented today for surgery, with the diagnosis of ACS  The various methods of treatment have been discussed with the patient and family. After consideration of risks, benefits and other options for treatment, the patient has consented to  Procedure(s) (LRB) with comments: LEFT HEART CATHETERIZATION WITH CORONARY ANGIOGRAM (N/A) as a surgical intervention .  The patient's history has been reviewed, patient examined, no change in status, stable for surgery.  I have reviewed the patient's chart and labs.  Questions were answered to the patient's satisfaction.     [name]

## 2012-01-18 NOTE — Progress Notes (Signed)
6 french sheath dc'd intact from R. Groin.  Manual pressure held for 22 minutes until hemostasis obtained.  No evidence of bleeding/hematoma R. Groin site.  2 pulse distal pulses, extremities warm to touch.  2nd nurse at bedside for sheath removal, Atropine at bedside.  Patient tolerated well without c/o nausea, chest pain or SOB.  Continue to monitor.

## 2012-01-18 NOTE — Progress Notes (Signed)
Patient seen and evaluated twice.  ECG was not much changed from tracing earlier this am without pain.  However, she continues to have arm and shoulder pain that has not responded to conservative measures.    I had reviewed films and the final result was excellent, and I discussed with Dr. Herbie Baltimore.  Will take back to the lab for relook angio.  Given ECG changes and echo suspect she has old ASMI.  Discussed with son and I also reviewed the films again with him.

## 2012-01-18 NOTE — Progress Notes (Signed)
Patient Name: [name] [name] Date of Encounter: 01/18/2012     Principal Problem:  *ST elevation MI (STEMI) - 2 sequential bifurcation (Prox & Mid LAD) 2 overlapping BMS  Active Problems:  Hypertension  GI bleed -- history of  Chronic anemia    SUBJECTIVE  Feeling better.  No chest pain.  Groin looks great.  Doing pretty well  CURRENT MEDS    . clopidogrel  75 mg Oral Q breakfast  . metoprolol tartrate  25 mg Oral BID  . pantoprazole  40 mg Oral Daily    OBJECTIVE  Filed Vitals:   01/18/12 0500 01/18/12 0600 01/18/12 0700 01/18/12 0725  BP: 100/51 110/51 103/49   Pulse: 79 83 80   Temp:    98.1 F (36.7 C)  TempSrc:    Oral  Resp: 16 16 17    Height:      Weight:      SpO2: 98% 99% 97%     Intake/Output Summary (Last 24 hours) at 01/18/12 0759 Last data filed at 01/18/12 0700  Gross per 24 hour  Intake  644.8 ml  Output   1925 ml  Net -1280.2 ml   Filed Weights   01/17/12 1100 01/17/12 1500  Weight: 141 lb (63.957 kg) 149 lb 7.6 oz (67.8 kg)    PHYSICAL EXAM  General: Pleasant, NAD. Neuro: Alert and oriented X 3. Moves all extremities spontaneously. Psych: Normal affect. HEENT:  Normal  Neck: Supple without bruits or JVD. Lungs:  Resp regular and unlabored, CTA. Heart: RRR no s3, s4, or murmurs. Abdomen: Soft, non-tender, non-distended, BS + x 4.  Extremities: No clubbing, cyanosis or edema. DP/PT/Radials 2+ and equal bilaterally.  Accessory Clinical Findings  CBC  Basename 01/18/12 0209 01/17/12 1510  WBC 9.8 11.2*  NEUTROABS -- --  HGB 10.1* 11.0*  HCT 31.0* 34.0*  MCV 83.8 83.5  PLT 209 214   Basic Metabolic Panel  Basename 01/18/12 0209 01/17/12 1510  NA 136 136  K 4.3 3.2*  CL 102 102  CO2 20 21  GLUCOSE 129* 194*  BUN 18 16  CREATININE 1.42* 1.12*  CALCIUM 8.6 8.5  MG -- --  PHOS -- --   Liver Function Tests No results found for this basename: AST:2,ALT:2,ALKPHOS:2,BILITOT:2,PROT:2,ALBUMIN:2 in the last 72 hours No  results found for this basename: LIPASE:2,AMYLASE:2 in the last 72 hours Cardiac Enzymes  Basename 01/18/12 0205 01/17/12 2013 01/17/12 1515  CKTOTAL -- -- --  CKMB -- -- --  CKMBINDEX -- -- --  TROPONINI >20.00* >20.00* >20.00*   BNP No components found with this basename: POCBNP:3 D-Dimer No results found for this basename: DDIMER:2 in the last 72 hours Hemoglobin A1C No results found for this basename: HGBA1C in the last 72 hours Fasting Lipid Panel No results found for this basename: CHOL,HDL,LDLCALC,TRIG,CHOLHDL,LDLDIRECT in the last 72 hours Thyroid Function Tests No results found for this basename: TSH,T4TOTAL,FREET3,T3FREE,THYROIDAB in the last 72 hours  TELE  NSR.  No major red alarms.    ECG  NSR.  Anteroseptal MI, age indeterminate.    Radiology/Studies  No results found.  ASSESSMENT AND PLAN   1.  MI sp PCI of the LAD with overlapping BMS. 2.  History of diverticular bleeding   Plan:  1.  Up in chair 2.  Cardiac rehab 3.  Start ASA.----likely 4 weeks of DAPT, then ASA alone 4.  DC foley if stable. 5.  Titrate beta blockers as tolerated 6.  Need EF to assess other therapies.  7.  Consider statins.    Signed, [name] MD, [facility], FSCAI

## 2012-01-18 NOTE — Progress Notes (Signed)
Dr Riley Kill paged and informed about patient having left arm pain that is now radiating to her back. She rates her pain an 8/10 and similar to her previous chest pain but "not as bad." Stat EKG done and Tylenol given. Md states he will follow up.

## 2012-01-18 NOTE — Progress Notes (Signed)
Etiology of symptoms unclear.  Still has some shoulder.  02 Sats are excellent despite ABG.  WBC is elevated, but could be related to post infarct. UA has bacteria.  Plan to get urine culture.  Continue to monitor in the unit.   Reviewed films with her children.

## 2012-01-18 NOTE — CV Procedure (Signed)
   Cardiac Catheterization Procedure Note  Name: [name] [name] MRN: [id number] DOB: January 25, 1926  Procedure:  Placement of catheters for angio without left heart cath, Selective Coronary Angiography,  Indication: continued pain post stent for MI   Procedural details: The left groin was prepped, draped, and anesthetized with 1% lidocaine. Using modified Seldinger technique, a 4 French sheath was introduced into the left femoral artery. Standard Judkins catheters were used for coronary angiography. Catheter exchanges were performed over a guidewire. There were no immediate procedural complications. The patient was transferred to the post catheterization recovery area for further monitoring.  I did review her films with her daughter and her son.    Procedural Findings: Hemodynamics:  AO 128/59 (88)  LV not done   Coronary angiography: Coronary dominance: right  Left mainstem: Short without significant obstruction.   Left anterior descending (LAD): The LAD was stented yesterday with overlapping BMS.  The stents remain widely patent to the apex.  There is ostial pinching of two small diagonals  (70-80%) that both exit from the stent.  Both have TIMI 3 flow.  The distal LAD wraps the apex  Left circumflex (LCx): provides an insignificant ramus, small OM1 with 50% proximal irregularity. The large OM and av circ are free of disease.    Right coronary artery (RCA): Large dominant vessel.  There is mild non obstructive ostial narrowing.  There is a non obstructive 40% narrowing in the proximal portion of the mid vessel.  The PDA is widely patent.    Left ventriculography:  Not done  Final Conclusions:   1.  Continued patency of the infarct related artery.   Recommendations:  1.  Continue to observe in the CCU until clearer as to stability.    [name] 01/18/2012, 2:19 PM

## 2012-01-19 ENCOUNTER — Inpatient Hospital Stay (HOSPITAL_COMMUNITY): Payer: Medicare Other

## 2012-01-19 DIAGNOSIS — I2109 ST elevation (STEMI) myocardial infarction involving other coronary artery of anterior wall: Secondary | ICD-10-CM

## 2012-01-19 DIAGNOSIS — N179 Acute kidney failure, unspecified: Secondary | ICD-10-CM

## 2012-01-19 DIAGNOSIS — R579 Shock, unspecified: Secondary | ICD-10-CM

## 2012-01-19 DIAGNOSIS — I4891 Unspecified atrial fibrillation: Secondary | ICD-10-CM

## 2012-01-19 DIAGNOSIS — J9600 Acute respiratory failure, unspecified whether with hypoxia or hypercapnia: Secondary | ICD-10-CM

## 2012-01-19 LAB — CBC
MCH: 27.2 pg (ref 26.0–34.0)
MCV: 85.4 fL (ref 78.0–100.0)
Platelets: 201 10*3/uL (ref 150–400)
RDW: 14.3 % (ref 11.5–15.5)

## 2012-01-19 LAB — BASIC METABOLIC PANEL
BUN: 28 mg/dL — ABNORMAL HIGH (ref 6–23)
CO2: 19 mEq/L (ref 19–32)
Calcium: 8.4 mg/dL (ref 8.4–10.5)
Chloride: 106 mEq/L (ref 96–112)
Chloride: 105 mEq/L (ref 96–112)
Creatinine, Ser: 1.69 mg/dL — ABNORMAL HIGH (ref 0.50–1.10)
Creatinine, Ser: 1.35 mg/dL — ABNORMAL HIGH (ref 0.50–1.10)
GFR calc Af Amer: 31 mL/min — ABNORMAL LOW (ref >90)
GFR calc Af Amer: 41 mL/min — ABNORMAL LOW (ref >90)
GFR calc non Af Amer: 27 mL/min — ABNORMAL LOW (ref >90)
Potassium: 4.3 mEq/L (ref 3.5–5.1)
Sodium: 136 mEq/L (ref 135–145)

## 2012-01-19 LAB — CORTISOL: Cortisol, Plasma: 22.2 ug/dL

## 2012-01-19 LAB — MAGNESIUM: Magnesium: 1.8 mg/dL (ref 1.5–2.5)

## 2012-01-19 LAB — PHOSPHORUS: Phosphorus: 3.2 mg/dL (ref 2.3–4.6)

## 2012-01-19 MED ORDER — SODIUM CHLORIDE 0.9 % IV SOLN
INTRAVENOUS | Status: AC
  Administered 2012-01-19: 1000 mL via INTRAVENOUS

## 2012-01-19 MED ORDER — DOPAMINE-DEXTROSE 3.2-5 MG/ML-% IV SOLN
2.0000 ug/kg/min | INTRAVENOUS | Status: DC
  Administered 2012-01-19: 7 ug/kg/min via INTRAVENOUS
  Administered 2012-01-19: 3 ug/kg/min via INTRAVENOUS

## 2012-01-19 MED ORDER — PANTOPRAZOLE SODIUM 40 MG IV SOLR
40.0000 mg | INTRAVENOUS | Status: DC
  Administered 2012-01-19: 40 mg via INTRAVENOUS
  Filled 2012-01-19 (×2): qty 40

## 2012-01-19 MED ORDER — SODIUM CHLORIDE 0.9 % IV SOLN
Freq: Once | INTRAVENOUS | Status: AC
  Administered 2012-01-19: 21:00:00 via INTRAVENOUS

## 2012-01-19 MED ORDER — AMIODARONE HCL IN DEXTROSE 360-4.14 MG/200ML-% IV SOLN
INTRAVENOUS | Status: AC
  Filled 2012-01-19: qty 200

## 2012-01-19 MED ORDER — AMIODARONE LOAD VIA INFUSION
150.0000 mg | Freq: Once | INTRAVENOUS | Status: AC
  Administered 2012-01-19: 150 mg via INTRAVENOUS
  Filled 2012-01-19: qty 83.34

## 2012-01-19 MED ORDER — HYDROCORTISONE SOD SUCCINATE 100 MG IJ SOLR
50.0000 mg | Freq: Four times a day (QID) | INTRAMUSCULAR | Status: DC
  Administered 2012-01-19 – 2012-01-20 (×4): 50 mg via INTRAVENOUS
  Filled 2012-01-19 (×8): qty 1

## 2012-01-19 MED ORDER — DOPAMINE-DEXTROSE 3.2-5 MG/ML-% IV SOLN
INTRAVENOUS | Status: AC
  Filled 2012-01-19: qty 250

## 2012-01-19 MED ORDER — AMIODARONE HCL IN DEXTROSE 360-4.14 MG/200ML-% IV SOLN
1.0000 mg/min | INTRAVENOUS | Status: AC
  Administered 2012-01-20: 1 mg/min via INTRAVENOUS

## 2012-01-19 MED ORDER — SODIUM CHLORIDE 0.9 % IV BOLUS (SEPSIS)
450.0000 mL | Freq: Once | INTRAVENOUS | Status: DC
  Administered 2012-01-19: 450 mL via INTRAVENOUS

## 2012-01-19 MED ORDER — ENOXAPARIN SODIUM 30 MG/0.3ML ~~LOC~~ SOLN
30.0000 mg | SUBCUTANEOUS | Status: DC
  Administered 2012-01-20 – 2012-02-02 (×14): 30 mg via SUBCUTANEOUS
  Filled 2012-01-19 (×15): qty 0.3

## 2012-01-19 MED ORDER — AMIODARONE HCL IN DEXTROSE 360-4.14 MG/200ML-% IV SOLN
0.5000 mg/min | INTRAVENOUS | Status: DC
  Administered 2012-01-20 (×2): 0.5 mg/min via INTRAVENOUS
  Filled 2012-01-19 (×5): qty 200

## 2012-01-19 MED ORDER — SODIUM CHLORIDE 0.9 % IV SOLN
INTRAVENOUS | Status: DC
  Administered 2012-01-19: 50 mL/h via INTRAVENOUS

## 2012-01-19 MED ORDER — SODIUM CHLORIDE 0.9 % IV BOLUS (SEPSIS)
500.0000 mL | Freq: Once | INTRAVENOUS | Status: AC
  Administered 2012-01-19: 500 mL via INTRAVENOUS

## 2012-01-19 MED ORDER — SODIUM CHLORIDE 0.9 % IV BOLUS (SEPSIS)
250.0000 mL | Freq: Once | INTRAVENOUS | Status: AC
  Administered 2012-01-19: 250 mL via INTRAVENOUS

## 2012-01-19 MED ORDER — LEVOFLOXACIN IN D5W 500 MG/100ML IV SOLN
500.0000 mg | INTRAVENOUS | Status: DC
  Administered 2012-01-21 – 2012-01-29 (×5): 500 mg via INTRAVENOUS
  Filled 2012-01-19 (×5): qty 100

## 2012-01-19 MED ORDER — AMIODARONE HCL IN DEXTROSE 360-4.14 MG/200ML-% IV SOLN: 60.0000 mg/h | INTRAVENOUS | Status: DC

## 2012-01-19 MED ORDER — LEVOFLOXACIN IN D5W 750 MG/150ML IV SOLN
750.0000 mg | Freq: Once | INTRAVENOUS | Status: AC
  Administered 2012-01-19: 750 mg via INTRAVENOUS
  Filled 2012-01-19: qty 150

## 2012-01-19 MED ORDER — NOREPINEPHRINE BITARTRATE 1 MG/ML IJ SOLN
2.0000 ug/min | INTRAVENOUS | Status: DC
  Administered 2012-01-19: 8 ug/min via INTRAVENOUS
  Administered 2012-01-20: 10 ug/min via INTRAVENOUS
  Administered 2012-01-21: 2 ug/min via INTRAVENOUS
  Filled 2012-01-19 (×4): qty 4

## 2012-01-19 NOTE — Progress Notes (Signed)
Seems to be improved.  Better spirits.  Appreciate CCM help in her care.  Spoke with the patients daughter about her situation.  Cr this pm is better, and CVP is only 3 so agree with mild, gentle hydration.   Will continue to monitor in unit, and transfer to stepdown in am if stable.  Discussed with Dr. Katina Degree.

## 2012-01-19 NOTE — Progress Notes (Signed)
Spoke with Dr. Riley Kill regarding regarding continued decrease in BP and no urine out put since foley catheter was removed around 6 pm. Patient is mentating well and denies need to void. Fluids are being given per orders and will start antibiotics when arrive.

## 2012-01-19 NOTE — Progress Notes (Signed)
Pt had STEMI s/p BMS to LAD, re-cath yesterday for continued pain, which showed that stents were patent.  This am, called that pt is hypotensive - SBP 106 and then in 70s-80s since being given her lopressor 25 mg at 10 pm this evening.  Nursing noticed sharp change in BPs after that medication was given.  Dr. Riley Kill was notified and started pt on standing IVFs after a bolus.  She has gotten a total of 1L IVFs.  He notified PCCM and pt with PNA on CXR and elevated WBC, so started on abx for PNA with thought that sepsis is possibly contributing to hypotension.  Nursing called me this am with continued MAPs in 40s despite all these interventions.  Gave pt additional bolus and came to see her.  She does not look grossly fluid up on exam - she has no peripheral edema, her JVP is about 11 cm, her resps are nonlabored, though she does have mild bibasilar crackles on exam.  SHe is alert.  Her groin sites on both sides are CDI and there is no bleeding or hematoma.  She continues to complain of mild chest pain similar to what she was taken back to the lab for yesterday.  Will give another 500 cc bolus and start dopamine.  Will get AM labs drawn early.  Notified PCCM that this was plan of action and they agreed - he stated that they would assess pt again in am for need for central line and further pressors.  Current MAP 55 on Dopa at 7, but pt tachycardic to 115 and may not be able to tolerate further titrations.  Will keep PCCM updated on her progress.

## 2012-01-19 NOTE — Consult Note (Signed)
Name: [name] Green MRN: [id number] DOB: 1925-10-15    LOS: 2  Referring Provider:  Cardiology Reason for Referral:  Sepsis  PULMONARY / CRITICAL CARE MEDICINE  HPI:  78 year old female with PMH of colon cancer presenting to [facility] with the chief complaint of CP, was taken to [facility] was noted to have a STEMI and was transferred to [facility] for cath where two stents were placed but there was significant anterior wall motion abnormalities.  On the day of presenting to PCCM the patient had recurrent chest pain and was taken to the cath lab again and was noted to have no abnormalities.  CXR was performed that was concerning for LLL PNA with pleural effusion and PCCM was called on consultation.  Currently patient reports feeling week, left sided chest pain and SOB but no other symptoms.  Past Medical History  Diagnosis Date  . Colon cancer     Remission, 2013  (Resection 2009)      . Hypertension   . GI bleed     Likely due to diverticular bleed, June, 2013  . Chronic anemia   . ST elevation MI (STEMI)     11 AM, January 17, 2012,  treated with urgent stenting of the LAD  January 17, 2012   History reviewed. No pertinent past surgical history. Prior to Admission medications   Medication Sig Start Date End Date Taking? Authorizing Provider  aspirin 81 MG chewable tablet Chew 81 mg by mouth daily.   Yes Historical Provider, MD  furosemide (LASIX) 40 MG tablet Take 40 mg by mouth daily.   Yes Historical Provider, MD  metoprolol tartrate (LOPRESSOR) 25 MG tablet Take 25 mg by mouth 2 (two) times daily.   Yes Historical Provider, MD   Allergies No Known Allergies  Family History History reviewed. No pertinent family history. Social History  does not have a smoking history on file. She does not have any smokeless tobacco history on file. Her alcohol and drug histories not on file.  Review Of Systems:  12 point review of system is negative other than mentioned above.  Brief patient description:   79 year old with STEMI s/p stent placement and now evolving LLL infiltrate with hypotension.  Current Status: Guarded.  Vital Signs: Temp:  [98 F (36.7 C)-98.8 F (37.1 C)] 98.8 F (37.1 C) (12/24 1100) Pulse Rate:  [74-110] 95  (12/24 1000) Resp:  [3-26] 21  (12/24 1000) BP: (75-164)/(32-73) 94/50 mmHg (12/24 1000) SpO2:  [90 %-99 %] 95 % (12/24 1000) FiO2 (%):  [50 %] 50 % (12/24 0615)  Physical Examination: General:  Chronically ill appearing elderly female in mild respiratory distress. Neuro:  Alert and oriented, following all commands, moving all ext to command. HEENT:  [place]/AT, PERRL, EOM-I and MMM. Neck:  Supple, -LAN and -thyromegally. Cardiovascular:  RRR, Nl S1/S2, -M/R/G. Lungs:  Diffuse rales L>R and decrease BS at the bases L>R. Abdomen:  Soft, NT, ND and +BS. Musculoskeletal:  -edema and -tenderness. Skin:  Intact.  Principal Problem:  *ST elevation MI (STEMI) - 2 sequential bifurcation (Prox & Mid LAD) 2 overlapping BMS  Active Problems:  Hypertension  GI bleed -- history of  Chronic anemia  ASSESSMENT AND PLAN  PULMONARY  Lab 01/17/12 1253  PHART 7.335*  PCO2ART 36.1  PO2ART 57.0*  HCO3 19.3*  O2SAT 88.0   Ventilator Settings: Vent Mode:  [-]  FiO2 (%):  [50 %] 50 % CXR:  LLL infiltrate and mild pleural effusion L>R. ETT:  None  A:  Likely hypoxemia as a combination of fluid overload and LLL PNA. P:   - Levofloxacin is an excellent drug to cover for CAP as the patient has not in a HCAP setting for >72 hours. - Pan culture. - Supplemental O2. - Follow CVP but only gentle hydration to avoid respiratory failure.  CARDIOVASCULAR  Lab 01/18/12 0205 01/17/12 2013 01/17/12 1515  TROPONINI >20.00* >20.00* >20.00*  LATICACIDVEN -- -- --  PROBNP -- -- --   ECG:  Per cards. Lines: L IJ TLC 12/24>>>  A: Hypotension likely septic. P:  - CVP, if elevated then cardiac if low then likely septic. - Gentle hydration as above. - Otherwise per  cards. - Change dopamine to levophed given septic picture. - Check cortisol level. - Stress dose steroids.  RENAL  Lab 01/19/12 0432 01/18/12 1718 01/18/12 0209 01/17/12 1510  NA 136 -- 136 136  K 4.6 -- 4.3 --  CL 105 -- 102 102  CO2 21 -- 20 21  BUN 26* -- 18 16  CREATININE 1.69* 1.42* 1.42* 1.12*  CALCIUM 8.4 -- 8.6 8.5  MG -- -- -- --  PHOS -- -- -- --   Intake/Output      12/23 0701 - 12/24 0700 12/24 0701 - 12/25 0700   P.O. 720    I.V. (mL/kg) 1765.9 (26) 56.5 (0.8)   IV Piggyback 957.5    Total Intake(mL/kg) 3443.4 (50.8) 56.5 (0.8)   Urine (mL/kg/hr) 600 (0.4) 225 (0.7)   Emesis/NG output 25 20   Total Output 625 245   Net +2818.4 -188.5         Foley:  12/23>>>  A:  Anticipate renal function to deteriorate as a combination of hypotension from sepsis and renal failure. P:   - Strict I/O. - Gentle hydration only. - No lasix. - Monitor electrolytes closely.  GASTROINTESTINAL No results found for this basename: AST:5,ALT:5,ALKPHOS:5,BILITOT:5,PROT:5,ALBUMIN:5 in the last 168 hours  A:  No current active issue. P:   - GI prophylaxis.  HEMATOLOGIC  Lab 01/19/12 0432 01/18/12 1718 01/18/12 1400 01/18/12 0209 01/17/12 1510  HGB 9.5* 10.1* 9.7* 10.1* 11.0*  HCT 29.8* 30.5* 29.8* 31.0* 34.0*  PLT 201 179 201 209 214  INR -- -- -- -- --  APTT -- -- -- -- --   A:  Leukocytosis, likely related to PNA. P:  - Monitor CBC daily.  INFECTIOUS  Lab 01/19/12 0432 01/18/12 1718 01/18/12 1400 01/18/12 0209 01/17/12 1510  WBC 19.1* 17.9* 13.3* 9.8 11.2*  PROCALCITON -- -- -- -- --   Cultures: Blood 12/24>>> Urine 12/24>>> Sputum 12/24>>> Antibiotics: Levofloxacin 12/23>>>  A:  PNA more likely the U/A showed high WBC so less likely to be a UTI. P:   - Levofloxacin should cover CAP and if UTI has excellent GN coverage which is what is more likely to be the cause of UTI if present.  ENDOCRINE No results found for this basename: GLUCAP:5 in the last 168  hours A:  Hypotension.   P:   - Cortisol level. - Stress dose steroids.  NEUROLOGIC  A:  Lethargy. P:   Likely related to sepsis, will monitor.  CC time 45 min.  Koren Bound, M.D. Pulmonary and Critical Care Medicine [facility] Pager: [phone]  01/19/2012, 11:49 AM

## 2012-01-19 NOTE — Progress Notes (Signed)
Called to see due to new onset rapid AF.  78 y/o woman admitted with Anterior STEMI s/p PCI of LAD and Diag. EF 35-40%. Also being treated for PNA. BP has been soft and has been on low-dose dopamine (unable to wean). I gave her some IVF earlier in the evening for CVP = 4.   Tonight abrupt onset of AF with rates up to 160. Feels palpitations but no CP. On exam, relatively comfortable. No heart failure.  Will start IV amio. Will not start heparin given need for Plavix and h/o of recent GIB in July.  Switch dopa to levophed for less irritability.   Will follow.   Critical Care Time = 35 mins.  Daniel Bensimhon,MD 10:54 PM

## 2012-01-19 NOTE — Progress Notes (Signed)
Manual BP taken and appears to coincide with the automatic cuff pressure.

## 2012-01-19 NOTE — Progress Notes (Signed)
CVP 4 to Dr. Gala Romney.  Orders rec'd.

## 2012-01-19 NOTE — Progress Notes (Signed)
Notified Dr. Lurline Idol of continued low BP despite fluid boluses given. New orders received. Will continue to monitor closely.

## 2012-01-19 NOTE — Progress Notes (Signed)
Discussed with RN. She will attempt to get to recliner today before we try to ambulate. Will f/u Thurs. Ethelda Chick CES, ACSM

## 2012-01-19 NOTE — Progress Notes (Signed)
No fever but CXR shows LLL suspected infiltrate.  Spoke with Dr. Sung Amabile and will start IV antibiotics (levaquin).  He will have CCM evaluate patient.  WBC is up but currently no fever.

## 2012-01-19 NOTE — Progress Notes (Signed)
Bladder scan performed. 132 cc residual fluid shown.

## 2012-01-19 NOTE — Progress Notes (Signed)
Patient Name: [name] Green Date of Encounter: 01/19/2012     Principal Problem:  *ST elevation MI (STEMI) - 2 sequential bifurcation (Prox & Mid LAD) 2 overlapping BMS  Active Problems:  Hypertension  GI bleed -- history of  Chronic anemia    SUBJECTIVE  Patient seen and evaluated.  Some mild nausea, but less pain in left side and shoulder compared to yesterday.   CURRENT MEDS    . sodium chloride   Intravenous Once  . aspirin  81 mg Oral Daily  . clopidogrel  75 mg Oral Q breakfast  . enoxaparin (LOVENOX) injection  40 mg Subcutaneous Q24H  . levofloxacin (LEVAQUIN) IV  500 mg Intravenous Q48H  . pantoprazole  40 mg Oral Daily    OBJECTIVE  Filed Vitals:   01/19/12 0445 01/19/12 0500 01/19/12 0600 01/19/12 0615  BP: 97/40 94/32 97/41  100/49  Pulse: 109 103 95 107  Temp:      TempSrc:      Resp: 20 19 17 15   Height:      Weight:      SpO2: 91% 90% 90% 95%    Intake/Output Summary (Last 24 hours) at 01/19/12 0758 Last data filed at 01/19/12 0600  Gross per 24 hour  Intake 3415.81 ml  Output    625 ml  Net 2790.81 ml   Filed Weights   01/17/12 1100 01/17/12 1500  Weight: 141 lb (63.957 kg) 149 lb 7.6 oz (67.8 kg)    PHYSICAL EXAM  General: Pleasant, NAD.  Pale appearing Neuro:  oriented X 3. Moves all extremities spontaneously. Psych: Normal affect. HEENT:  Normal  Neck: Supple without bruits.  JVD does not appear significantly elevated.   Lungs:  Mildly labored respiration.  Decrease BS at both bases.   Heart: RRR.  Positive S4 galllop Abdomen: Soft, non-tender, non-distended, BS + x 4.  Extremities: No clubbing, cyanosis or edema. DP/PT/Radials 2+ and equal bilaterally.  Groins are stable.  Bandage on left.    Accessory Clinical Findings  CBC  Basename 01/19/12 0432 01/18/12 1718  WBC 19.1* 17.9*  NEUTROABS -- --  HGB 9.5* 10.1*  HCT 29.8* 30.5*  MCV 85.4 83.1  PLT 201 179   Basic Metabolic Panel  Basename 01/19/12 0432 01/18/12  1718 01/18/12 0209  NA 136 -- 136  K 4.6 -- 4.3  CL 105 -- 102  CO2 21 -- 20  GLUCOSE 113* -- 129*  BUN 26* -- 18  CREATININE 1.69* 1.42* --  CALCIUM 8.4 -- 8.6  MG -- -- --  PHOS -- -- --   Liver Function Tests No results found for this basename: AST:2,ALT:2,ALKPHOS:2,BILITOT:2,PROT:2,ALBUMIN:2 in the last 72 hours No results found for this basename: LIPASE:2,AMYLASE:2 in the last 72 hours Cardiac Enzymes  Basename 01/18/12 0205 01/17/12 2013 01/17/12 1515  CKTOTAL -- -- --  CKMB -- -- --  CKMBINDEX -- -- --  TROPONINI >20.00* >20.00* >20.00*   BNP No components found with this basename: POCBNP:3 D-Dimer No results found for this basename: DDIMER:2 in the last 72 hours Hemoglobin A1C No results found for this basename: HGBA1C in the last 72 hours Fasting Lipid Panel No results found for this basename: CHOL,HDL,LDLCALC,TRIG,CHOLHDL,LDLDIRECT in the last 72 hours Thyroid Function Tests No results found for this basename: TSH,T4TOTAL,FREET3,T3FREE,THYROIDAB in the last 72 hours    ECG  Similar to before.    Radiology/Studies  Dg Chest Port 1v Same Day  01/18/2012  *RADIOLOGY REPORT*  Clinical Data: Myocardial infarction.  PORTABLE CHEST -  1 VIEW SAME DAY  Comparison: No priors.  Findings: Volumes are normal.  Consolidation in the left lower lobe with a small left pleural effusion.  Right lung is clear. Pulmonary vasculature is within normal limits.  Heart size is mildly enlarged.  Upper mediastinal contours are unremarkable. Atherosclerosis in the thoracic aorta.  IMPRESSION: 1.  Left lower lobe consolidation concerning for pneumonia with small left parapneumonic pleural effusion. 2.  Mild cardiomegaly. 3.  Atherosclerosis.   Original Report Authenticated By: Trudie Reed, M.D.     ASSESSMENT AND PLAN  1.  Anterior MI treated with BMS times two with recurrent pain--patent IRA 2.  Left lower lobe pneumonia likely with rising wbc, infiltrate on Levaquin 3.  Rising  serum Cr sp two procedures and hypotension currently on dopamine.   4.  Chronic anemia  PLAN  1.. Continue levaquin for Hosp acquired pneumonia 2.  Continue DAPT with close monitoring of hgb. 3.  CCM consult  -  Central line.  Continue dopamine--?switch to 4.  May need renal involvement.    I reviewed with family yesterday and spoke with son this am.  Expect a rocky course for several days.  Signed,   [name] MD, [facility], FSCAI

## 2012-01-19 NOTE — Progress Notes (Signed)
Patients daughter took patients diamond ring and two other rings along with patients necklace home with her.

## 2012-01-19 NOTE — Progress Notes (Signed)
Patient in rapid AFIB with RVR-stat 12 lead done.  Dr. Gala Romney paged-orders rec'd.  Patient denies CP/SOB.  Weaning Dopamine and changing to Levophed.

## 2012-01-19 NOTE — Procedures (Signed)
Central Venous Catheter Insertion Procedure Note [name] Green [id number] 19-Aug-1925  Procedure: Insertion of Central Venous Catheter Indications: Assessment of intravascular volume, Drug and/or fluid administration, Frequent blood sampling and   Procedure Details Consent: Risks of procedure as well as the alternatives and risks of each were explained to the (patient/caregiver).  Consent for procedure obtained. Time Out: Verified patient identification, verified procedure, site/side was marked, verified correct patient position, special equipment/implants available, medications/allergies/relevent history reviewed, required imaging and test results available.  Performed  Maximum sterile technique was used including antiseptics, cap, gloves, gown, hand hygiene, mask and sheet. Skin prep: Chlorhexidine; local anesthetic administered A antimicrobial bonded/coated triple lumen catheter was placed in the left internal jugular vein using the Seldinger technique. Ultrasound guidance used.yes Catheter placed to 20 cm. Blood aspirated via all 3 ports and then flushed x 3. Line sutured x 2 and dressing applied.  Evaluation Blood flow good Complications: No apparent complications Patient did tolerate procedure well. Chest X-ray ordered to verify placement.  CXR: pending.  Brett Canales Minor ACNP [facility] PCCM Pager [phone] till 3 pm If no answer page [phone] 01/19/2012, 10:21 AM  U/S used in placement.  Patient seen and examined, agree with above note.  I dictated the care and orders written for this patient under my direction.  Koren Bound, M.D. [phone]

## 2012-01-19 NOTE — Progress Notes (Signed)
Pt now with MAP 61 on dopamine 7 with heart rate of 110s.  Labs this am show WBC up to 19 and Cr is up to 1.7.  Hgb is stable.  Will continue current course of treatment for now.  PCCM following  Per RN, pt has not had any urine output since 6 pm yesterday when her [name] Green catheter was removed.  She has not made any urine in response to the IVFs she has gotten either.  Will have nurses check a bladder scan to see if pt is retaining urine.    Cr elevation could be ATN from hypotension vs CIN from her cath procedures this week

## 2012-01-20 ENCOUNTER — Encounter (HOSPITAL_COMMUNITY): Payer: Self-pay | Admitting: *Deleted

## 2012-01-20 ENCOUNTER — Inpatient Hospital Stay (HOSPITAL_COMMUNITY): Payer: Medicare Other

## 2012-01-20 LAB — PHOSPHORUS: Phosphorus: 3 mg/dL (ref 2.3–4.6)

## 2012-01-20 LAB — URINE CULTURE

## 2012-01-20 LAB — CBC
Hemoglobin: 8.6 g/dL — ABNORMAL LOW (ref 12.0–15.0)
RBC: 3.13 MIL/uL — ABNORMAL LOW (ref 3.87–5.11)

## 2012-01-20 LAB — BASIC METABOLIC PANEL
CO2: 16 mEq/L — ABNORMAL LOW (ref 19–32)
Glucose, Bld: 155 mg/dL — ABNORMAL HIGH (ref 70–99)
Potassium: 3.7 mEq/L (ref 3.5–5.1)
Sodium: 138 mEq/L (ref 135–145)

## 2012-01-20 MED ORDER — SODIUM CHLORIDE 0.9 % IV SOLN
INTRAVENOUS | Status: DC
  Administered 2012-01-20: 19:00:00 via INTRAVENOUS
  Administered 2012-01-21: 20 mL/h via INTRAVENOUS
  Administered 2012-01-29: 06:00:00 via INTRAVENOUS

## 2012-01-20 MED ORDER — AMIODARONE LOAD VIA INFUSION
150.0000 mg | Freq: Once | INTRAVENOUS | Status: AC
  Administered 2012-01-20: 150 mg via INTRAVENOUS
  Filled 2012-01-20: qty 83.34

## 2012-01-20 MED ORDER — AMIODARONE HCL IN DEXTROSE 360-4.14 MG/200ML-% IV SOLN
60.0000 mg/h | INTRAVENOUS | Status: DC
  Administered 2012-01-20: 60 mg/h via INTRAVENOUS
  Administered 2012-01-21: 30 mg/h via INTRAVENOUS
  Filled 2012-01-20 (×5): qty 200

## 2012-01-20 MED ORDER — SODIUM CHLORIDE 0.9 % IV SOLN: INTRAVENOUS | Status: AC

## 2012-01-20 MED ORDER — BIOTENE DRY MOUTH MT LIQD
15.0000 mL | Freq: Two times a day (BID) | OROMUCOSAL | Status: DC
  Administered 2012-01-20 – 2012-01-24 (×8): 15 mL via OROMUCOSAL

## 2012-01-20 MED ORDER — HYDROCORTISONE SOD SUCCINATE 100 MG IJ SOLR
50.0000 mg | Freq: Four times a day (QID) | INTRAMUSCULAR | Status: DC
  Administered 2012-01-20 – 2012-01-23 (×11): 50 mg via INTRAVENOUS
  Administered 2012-01-23: 14:00:00 via INTRAVENOUS
  Administered 2012-01-23 – 2012-01-27 (×15): 50 mg via INTRAVENOUS
  Filled 2012-01-20 (×32): qty 1

## 2012-01-20 MED ORDER — PANTOPRAZOLE SODIUM 40 MG PO TBEC
40.0000 mg | DELAYED_RELEASE_TABLET | Freq: Every day | ORAL | Status: DC
  Administered 2012-01-20 – 2012-02-03 (×15): 40 mg via ORAL
  Filled 2012-01-20 (×15): qty 1

## 2012-01-20 NOTE — Progress Notes (Addendum)
Patient Name: [name] Green Date of Encounter: 01/20/2012     Principal Problem:  *ST elevation MI (STEMI) - 2 sequential bifurcation (Prox & Mid LAD) 2 overlapping BMS  Active Problems:  Hypertension  GI bleed -- history of  Chronic anemia  Acute respiratory failure  Shock  Acute renal failure  Atrial fibrillation    SUBJECTIVE  She looks quite good.  No chest pain.  No increase SOB.  Events of last night noted, and ECG reviewed.    CURRENT MEDS    . aspirin  81 mg Oral Daily  . clopidogrel  75 mg Oral Q breakfast  . enoxaparin (LOVENOX) injection  30 mg Subcutaneous Q24H  . hydrocortisone sodium succinate  50 mg Intravenous Q6H  . levofloxacin (LEVAQUIN) IV  500 mg Intravenous Q48H  . pantoprazole (PROTONIX) IV  40 mg Intravenous Q24H    OBJECTIVE  Filed Vitals:   01/20/12 0500 01/20/12 0545 01/20/12 0600 01/20/12 0700  BP: 110/47  123/60 119/58  Pulse: 81 112 109 105  Temp:      TempSrc:      Resp: 18 17 18 17   Height:      Weight:      SpO2: 97% 96% 96% 96%    Intake/Output Summary (Last 24 hours) at 01/20/12 0828 Last data filed at 01/20/12 0600  Gross per 24 hour  Intake 1862.44 ml  Output    675 ml  Net 1187.44 ml   Filed Weights   01/17/12 1100 01/17/12 1500 01/20/12 0441  Weight: 141 lb (63.957 kg) 149 lb 7.6 oz (67.8 kg) 149 lb 11.1 oz (67.9 kg)    PHYSICAL EXAM  General: Elderly female lying in bed.   Neuro: Alert and oriented X 3. Moves all extremities spontaneously. Psych: Normal affect. HEENT:  Normal.  JVP 5cm by transducer.   Neck: Supple without bruits or JVD. Lungs: Marked decrease BS left base.  Soft crackles R base Heart: RRR.  There is a SEM more prominent today along left sternal edge with radiation to base   (see echo--not holosystolic.   Abdomen: Soft, non-tender, non-distended, BS + x 4.  Extremities: No clubbing, cyanosis or edema. DP/PT/Radials 2+ and equal bilaterally.  Accessory Clinical Findings  CBC  Basename  01/20/12 0430 01/19/12 0432  WBC 18.5* 19.1*  NEUTROABS -- --  HGB 8.6* 9.5*  HCT 26.5* 29.8*  MCV 84.7 85.4  PLT 275 201   Basic Metabolic Panel  Basename 01/20/12 0430 01/19/12 1600 01/19/12 1305  NA 138 137 --  K 3.7 4.3 --  CL 105 105 --  CO2 16* 20 --  GLUCOSE 155* 114* --  BUN 33* 28* --  CREATININE 1.45* 1.34* --  CALCIUM 8.6 8.7 --  MG 1.8 -- 1.8  PHOS 3.0 -- 3.2   Liver Function Tests No results found for this basename: AST:2,ALT:2,ALKPHOS:2,BILITOT:2,PROT:2,ALBUMIN:2 in the last 72 hours No results found for this basename: LIPASE:2,AMYLASE:2 in the last 72 hours Cardiac Enzymes  Basename 01/18/12 0205 01/17/12 2013 01/17/12 1515  CKTOTAL -- -- --  CKMB -- -- --  CKMBINDEX -- -- --  TROPONINI >20.00* >20.00* >20.00*   BNP No components found with this basename: POCBNP:3 D-Dimer No results found for this basename: DDIMER:2 in the last 72 hours Hemoglobin A1C No results found for this basename: HGBA1C in the last 72 hours Fasting Lipid Panel No results found for this basename: CHOL,HDL,LDLCALC,TRIG,CHOLHDL,LDLDIRECT in the last 72 hours Thyroid Function Tests No results found for this basename: TSH,T4TOTAL,FREET3,T3FREE,THYROIDAB  in the last 72 hours  TELE  Strips reviewed.  ECG with SVT reviewed.    ECG  No ECG today.   Radiology/Studies  Dg Chest Port 1 View  01/19/2012  *RADIOLOGY REPORT*  Clinical Data: Heart failure, confirm central line placement  PORTABLE CHEST - 1 VIEW  Comparison: 01/18/2012  Findings: Left internal jugular central venous line has been placed.  Tip projects over the anticipated position of the superior vena cava just above the cavoatrial junction.  There is no pneumothorax.  Stable mild left lower lobe consolidation.  There is also new mild right lower lobe atelectasis.  No definite pulmonary edema.  IMPRESSION: Left IJ central line placed with no pneumothorax.   Original Report Authenticated By: Esperanza Heir, M.D.    Dg  Chest Port 1v Same Day  01/18/2012  *RADIOLOGY REPORT*  Clinical Data: Myocardial infarction.  PORTABLE CHEST - 1 VIEW SAME DAY  Comparison: No priors.  Findings: Volumes are normal.  Consolidation in the left lower lobe with a small left pleural effusion.  Right lung is clear. Pulmonary vasculature is within normal limits.  Heart size is mildly enlarged.  Upper mediastinal contours are unremarkable. Atherosclerosis in the thoracic aorta.  IMPRESSION: 1.  Left lower lobe consolidation concerning for pneumonia with small left parapneumonic pleural effusion. 2.  Mild cardiomegaly. 3.  Atherosclerosis.   Original Report Authenticated By: Trudie Reed, M.D.     ASSESSMENT AND PLAN   1.  SP anterior MI with sig LV dysfunction, stenting, and patent stent with fu cath at time of severe recurrent chest pain 2.  Persistent moderate hypotension now improved with increased LV filling 3.  Atrial fibrillation now resolved on amiodarone drip.  4.  Possible HCAPS on levaquin. 5.  Systolic murmur probably due to focal basal hypertrophy and tachycardia, but will repeat echo if worsens (She is at risk for VSD).  6.  Urinary tract infection----likely there at beginning---E. Coli---asymptomatic but on Levaquin for possible HCAPS  (this was also probably there and help precipitated MI as well.        Plan  1.  Taper levophed as tolerated with again mild gentle hydration 2.  Watch Hgb closely--no evidence of bleed at present.  3.  Recheck BMET in am  --- mild renal hypoperfusion  ? Secondary to hypovolemia and shock. 4.  Defer to  CCM re: antibiotics.  No change for now. 5.  Continue levaquin for UTI.   45 minutes of direct patient care.   Signed, [name] MD, [facility], FSCAI

## 2012-01-20 NOTE — Progress Notes (Signed)
Name: [name] Green MRN: [id number] DOB: 1925-09-09    LOS: 3  Referring Provider:  Cardiology Reason for Referral:  Sepsis  PULMONARY / CRITICAL CARE MEDICINE  Brief patient description:  78 year old with STEMI s/p stent placement, and developed evolving LLL infiltrate with shock.  PCCM consulted 12/24.  Current Status:  Denies chest pain.  Still has cough, but not much sputum.  Not much appetite.  Vital Signs: Temp:  [97.6 F (36.4 C)-99.1 F (37.3 C)] 97.7 F (36.5 C) (12/25 0800) Pulse Rate:  [81-148] 95  (12/25 0800) Resp:  [14-21] 18  (12/25 0800) BP: (83-132)/(40-66) 115/58 mmHg (12/25 0800) SpO2:  [92 %-99 %] 98 % (12/25 0800) Weight:  [149 lb 11.1 oz (67.9 kg)] 149 lb 11.1 oz (67.9 kg) (12/25 0441)  JY = BFO Physical Examination: General: No distress Neuro:  Normal strength HEENT: No sinus tenderness Cardiovascular:  s1s2 tachycardic, no murmur Lungs:  Scattered rhonchi, no wheeze Abdomen:  Soft, non tender Musculoskeletal: No edema Skin:  No rashes  Imaging: Dg Chest Port 1 View  01/20/2012  *RADIOLOGY REPORT*  Clinical Data: Evaluate endotracheal tube position  PORTABLE CHEST - 1 VIEW  Comparison: Portable chest x-ray of 01/19/2012  Findings: The lungs appear better aerated.  Opacity remains at the left lung base consistent with atelectasis and effusion although pneumonia cannot be excluded.  There may be a small right pleural effusion present as well.  No endotracheal tube is seen.  A left central venous line tip overlies the lower SVC.  IMPRESSION: Improved aeration.  Persistent left basilar opacity consistent with atelectasis, effusion, and possibly pneumonia.   Original Report Authenticated By: Dwyane Dee, M.D.    Dg Chest Port 1 View  01/19/2012  *RADIOLOGY REPORT*  Clinical Data: Heart failure, confirm central line placement  PORTABLE CHEST - 1 VIEW  Comparison: 01/18/2012  Findings: Left internal jugular central venous line has been placed.  Tip projects  over the anticipated position of the superior vena cava just above the cavoatrial junction.  There is no pneumothorax.  Stable mild left lower lobe consolidation.  There is also new mild right lower lobe atelectasis.  No definite pulmonary edema.  IMPRESSION: Left IJ central line placed with no pneumothorax.   Original Report Authenticated By: Esperanza Heir, M.D.    Dg Chest Port 1v Same Day  01/18/2012  *RADIOLOGY REPORT*  Clinical Data: Myocardial infarction.  PORTABLE CHEST - 1 VIEW SAME DAY  Comparison: No priors.  Findings: Volumes are normal.  Consolidation in the left lower lobe with a small left pleural effusion.  Right lung is clear. Pulmonary vasculature is within normal limits.  Heart size is mildly enlarged.  Upper mediastinal contours are unremarkable. Atherosclerosis in the thoracic aorta.  IMPRESSION: 1.  Left lower lobe consolidation concerning for pneumonia with small left parapneumonic pleural effusion. 2.  Mild cardiomegaly. 3.  Atherosclerosis.   Original Report Authenticated By: Trudie Reed, M.D.      Principal Problem:  *ST elevation MI (STEMI) - 2 sequential bifurcation (Prox & Mid LAD) 2 overlapping BMS  Active Problems:  Hypertension  GI bleed -- history of  Chronic anemia  Acute respiratory failure  Shock  Acute renal failure  Atrial fibrillation  ASSESSMENT AND PLAN  PULMONARY  Lab 01/17/12 1253  PHART 7.335*  PCO2ART 36.1  PO2ART 57.0*  HCO3 19.3*  O2SAT 88.0    ETT:  None  A:  Acute hypoxic respiratory failure 2nd to PNA and pulmonary edema. P:  F/u CXR Adjust oxygen to keep SpO2 > 92%  CARDIOVASCULAR  Lab 01/18/12 0205 01/17/12 2013 01/17/12 1515  TROPONINI >20.00* >20.00* >20.00*  LATICACIDVEN -- -- --  PROBNP -- -- --   Lines: L IJ TLC 12/24>>>  A: Shock>>cardiogenic, septic, and relative adrenal insufficiency. STEMI. Acute systolic CHF. Atrial fibrillation. P:  Wean pressors to keep SBP > 90, MAP > 65 Fluids for goal CVP >  6 Continue solucortef Amiodarone per cardiology Continue ASA, plavix   RENAL  Lab 01/20/12 0430 01/19/12 1600 01/19/12 1305 01/19/12 0432 01/18/12 1718 01/18/12 0209  NA 138 137 137 136 -- 136  K 3.7 4.3 -- -- -- --  CL 105 105 106 105 -- 102  CO2 16* 20 19 21  -- 20  BUN 33* 28* 28* 26* -- 18  CREATININE 1.45* 1.34* 1.35* 1.69* 1.42* --  CALCIUM 8.6 8.7 8.6 8.4 -- 8.6  MG 1.8 -- 1.8 -- -- --  PHOS 3.0 -- 3.2 -- -- --   Intake/Output      12/24 0701 - 12/25 0700 12/25 0701 - 12/26 0700   P.O. 300    I.V. (mL/kg) 1717.2 (25.3) 141.7 (2.1)   IV Piggyback     Total Intake(mL/kg) 2017.2 (29.7) 141.7 (2.1)   Urine (mL/kg/hr) 675 (0.4)    Emesis/NG output 20    Total Output 695    Net +1322.2 +141.7         Foley:  12/23>>>  A:  Acute renal insufficiency. Improved with pressors, volume resuscitation. P:   Monitor renal fx, urine outpt, electrolytes  GASTROINTESTINAL No results found for this basename: AST:5,ALT:5,ALKPHOS:5,BILITOT:5,PROT:5,ALBUMIN:5 in the last 168 hours  A:  Nutrition. P:   Encourage po intake  HEMATOLOGIC  Lab 01/20/12 0430 01/19/12 0432 01/18/12 1718 01/18/12 1400 01/18/12 0209  HGB 8.6* 9.5* 10.1* 9.7* 10.1*  HCT 26.5* 29.8* 30.5* 29.8* 31.0*  PLT 275 201 179 201 209  INR -- -- -- -- --  APTT -- -- -- -- --   A:  Anemia of critical illness. P:  F/u CBC Transfuse for Hb < 8 in setting of ACS  INFECTIOUS  Lab 01/20/12 0430 01/19/12 0432 01/18/12 1718 01/18/12 1400 01/18/12 0209  WBC 18.5* 19.1* 17.9* 13.3* 9.8  PROCALCITON -- -- -- -- --   Cultures: Blood 12/24>>> Urine 12/24>>>E coli 60K colonies Sputum 12/24>>>  Antibiotics: Levofloxacin 12/23>>>  A:  PNA. E coli UTI. P:   Continue levaquin  ENDOCRINE No results found for this basename: GLUCAP:5 in the last 168 hours A:  Relative adrenal insufficiency in setting of sepsis.   P:   Stress dose steroids.  NEUROLOGIC  A:  Acute encephalopathy 2nd to sepsis, shock,  hypoxia. Improved 12/25. P:   Monitor clinically.  CC time 35 min  Updated family at bedside  Coralyn Helling, MD [facility] Pulmonary/Critical Care 01/20/2012, 9:53 AM Pager:  [phone] After 3pm call: [phone]

## 2012-01-20 NOTE — Progress Notes (Signed)
Patient seen and examined.  Went back into atrial fib, and we gave a 150mg  bolus of Amiodarone and this resolved.  She remains stable and BP is up.  Will increase dose for six hours, then back down, then switch to PO in the am.  Spoke with son.  As with Dr. Dorthea Cove note, am reluctant to anticoagulate given her GI bleed and current need for DAPT with stenting.  We could get by with one month of DAPT given BMS utilization   (Which I think for her was a good choice).

## 2012-01-21 ENCOUNTER — Inpatient Hospital Stay (HOSPITAL_COMMUNITY): Payer: Medicare Other

## 2012-01-21 DIAGNOSIS — D649 Anemia, unspecified: Secondary | ICD-10-CM

## 2012-01-21 LAB — TYPE AND SCREEN
ABO/RH(D): A POS
Antibody Screen: NEGATIVE

## 2012-01-21 LAB — BASIC METABOLIC PANEL
GFR calc Af Amer: 38 mL/min — ABNORMAL LOW (ref >90)
GFR calc non Af Amer: 33 mL/min — ABNORMAL LOW (ref >90)
Potassium: 3.5 mEq/L (ref 3.5–5.1)
Sodium: 139 mEq/L (ref 135–145)

## 2012-01-21 LAB — CBC
MCHC: 32.9 g/dL (ref 30.0–36.0)
RDW: 14.1 % (ref 11.5–15.5)

## 2012-01-21 MED ORDER — POTASSIUM CHLORIDE CRYS ER 20 MEQ PO TBCR
40.0000 meq | EXTENDED_RELEASE_TABLET | Freq: Once | ORAL | Status: AC
  Administered 2012-01-21: 40 meq via ORAL
  Filled 2012-01-21: qty 2

## 2012-01-21 MED ORDER — AMIODARONE HCL IN DEXTROSE 360-4.14 MG/200ML-% IV SOLN
30.0000 mg/h | INTRAVENOUS | Status: DC
  Administered 2012-01-21 – 2012-01-22 (×2): 30 mg/h via INTRAVENOUS
  Filled 2012-01-21 (×5): qty 200

## 2012-01-21 NOTE — Progress Notes (Signed)
Name: [name] Green MRN: [id number] DOB: 20-Mar-1925    LOS: 4  Referring Provider:  Cardiology Reason for Referral:  Sepsis  PULMONARY / CRITICAL CARE MEDICINE  Brief patient description:  78 year old with STEMI s/p stent placement, and developed evolving LLL infiltrate with shock.  PCCM consulted 12/24.  Current Status:  Denies chest pain.  Still has cough, but not much sputum.  Not much appetite.  Vital Signs: Temp:  [97.5 F (36.4 C)-98.6 F (37 C)] 98.6 F (37 C) (12/26 0800) Pulse Rate:  [79-142] 90  (12/26 0900) Resp:  [15-26] 21  (12/26 0900) BP: (75-129)/(40-62) 94/60 mmHg (12/26 0900) SpO2:  [95 %-99 %] 95 % (12/26 0900)  JY = BFO Physical Examination: General: No distress Neuro:  Normal strength HEENT: No sinus tenderness Cardiovascular:  s1s2 tachycardic, no murmur Lungs:  Scattered rhonchi, no wheeze Abdomen:  Soft, non tender Musculoskeletal: No edema Skin:  No rashes  Imaging: Dg Chest Port 1 View  01/21/2012  *RADIOLOGY REPORT*  Clinical Data: Follow up pulmonary infiltrates.  PORTABLE CHEST - 1 VIEW  Comparison: One-view chest 01/20/2012  Findings: The heart size is exaggerated by low lung volumes.  The left IJ line is stable.  A left pleural effusion has slightly increased.  Bilateral pulmonary vascular congestion is more conspicuous.  Bibasilar airspace disease has increased slightly, worse on the left.  IMPRESSION:  1.  Increasing pulmonary vascular congestion and bilateral pleural effusions, suggesting congestive heart failure. 2.  Increasing bibasilar airspace disease, left greater than right. While this likely represents atelectasis, infection is not excluded.   Original Report Authenticated By: Marin Roberts, M.D.    Dg Chest Port 1 View  01/20/2012  *RADIOLOGY REPORT*  Clinical Data: Evaluate endotracheal tube position  PORTABLE CHEST - 1 VIEW  Comparison: Portable chest x-ray of 01/19/2012  Findings: The lungs appear better aerated.  Opacity  remains at the left lung base consistent with atelectasis and effusion although pneumonia cannot be excluded.  There may be a small right pleural effusion present as well.  No endotracheal tube is seen.  A left central venous line tip overlies the lower SVC.  IMPRESSION: Improved aeration.  Persistent left basilar opacity consistent with atelectasis, effusion, and possibly pneumonia.   Original Report Authenticated By: Dwyane Dee, M.D.    Dg Chest Port 1 View  01/19/2012  *RADIOLOGY REPORT*  Clinical Data: Heart failure, confirm central line placement  PORTABLE CHEST - 1 VIEW  Comparison: 01/18/2012  Findings: Left internal jugular central venous line has been placed.  Tip projects over the anticipated position of the superior vena cava just above the cavoatrial junction.  There is no pneumothorax.  Stable mild left lower lobe consolidation.  There is also new mild right lower lobe atelectasis.  No definite pulmonary edema.  IMPRESSION: Left IJ central line placed with no pneumothorax.   Original Report Authenticated By: Esperanza Heir, M.D.    Principal Problem:  *ST elevation MI (STEMI) - 2 sequential bifurcation (Prox & Mid LAD) 2 overlapping BMS  Active Problems:  Hypertension  GI bleed -- history of  Chronic anemia  Acute respiratory failure  Shock  Acute renal failure  Atrial fibrillation  ASSESSMENT AND PLAN  PULMONARY  Lab 01/17/12 1253  PHART 7.335*  PCO2ART 36.1  PO2ART 57.0*  HCO3 19.3*  O2SAT 88.0    ETT:  None  A:  Acute hypoxic respiratory failure 2nd to PNA and pulmonary edema. P:   F/u CXR Adjust oxygen to keep SpO2 >  92% Needs diureses but with renal function and contrast load and low BP will wit until off pressors.  CARDIOVASCULAR  Lab 01/18/12 0205 01/17/12 2013 01/17/12 1515  TROPONINI >20.00* >20.00* >20.00*  LATICACIDVEN -- -- --  PROBNP -- -- --   Lines: L IJ TLC 12/24>>>  A: Shock>>cardiogenic, septic, and relative adrenal  insufficiency. STEMI. Acute systolic CHF. Atrial fibrillation. P:  Wean pressors to keep SBP > 90, MAP > 65. Fluids for goal CVP > 6. Continue solucortef. Amiodarone per cardiology. Continue ASA, plavix.  RENAL  Lab 01/21/12 0500 01/20/12 0430 01/19/12 1600 01/19/12 1305 01/19/12 0432  NA 139 138 137 137 136  K 3.5 3.7 -- -- --  CL 108 105 105 106 105  CO2 19 16* 20 19 21   BUN 31* 33* 28* 28* 26*  CREATININE 1.41* 1.45* 1.34* 1.35* 1.69*  CALCIUM 8.4 8.6 8.7 8.6 8.4  MG -- 1.8 -- 1.8 --  PHOS -- 3.0 -- 3.2 --   Intake/Output      12/25 0701 - 12/26 0700 12/26 0701 - 12/27 0700   P.O. 270    I.V. (mL/kg) 2216.5 (32.6) 262.6 (3.9)   IV Piggyback 100    Total Intake(mL/kg) 2586.5 (38.1) 262.6 (3.9)   Urine (mL/kg/hr) 525 (0.3) 250   Emesis/NG output     Total Output 525 250   Net +2061.5 +12.6          Intake/Output Summary (Last 24 hours) at 01/21/12 1018 Last data filed at 01/21/12 1000  Gross per 24 hour  Intake 2378.29 ml  Output    775 ml  Net 1603.29 ml   Foley:  12/23>>>  A:  Acute renal insufficiency. Improved with pressors, volume resuscitation. P:   Monitor renal fx, urine outpt, electrolytes  GASTROINTESTINAL No results found for this basename: AST:5,ALT:5,ALKPHOS:5,BILITOT:5,PROT:5,ALBUMIN:5 in the last 168 hours  A:  Nutrition. P:   Encourage po intake  HEMATOLOGIC  Lab 01/21/12 0500 01/20/12 0430 01/19/12 0432 01/18/12 1718 01/18/12 1400  HGB 7.9* 8.6* 9.5* 10.1* 9.7*  HCT 24.0* 26.5* 29.8* 30.5* 29.8*  PLT 199 275 201 179 201  INR -- -- -- -- --  APTT -- -- -- -- --   A:  Anemia of critical illness. P:  F/u CBC Transfuse for Hb < 8 in setting of ACS  INFECTIOUS  Lab 01/21/12 0500 01/20/12 0430 01/19/12 0432 01/18/12 1718 01/18/12 1400  WBC 15.1* 18.5* 19.1* 17.9* 13.3*  PROCALCITON -- -- -- -- --   Cultures: Blood 12/24>>> Urine 12/24>>>E coli 60K colonies Sputum 12/24>>>  Antibiotics: Levofloxacin 12/23>>>  A:  PNA. E  coli UTI. P:   Continue levaquin  ENDOCRINE No results found for this basename: GLUCAP:5 in the last 168 hours A:  Relative adrenal insufficiency in setting of sepsis.   P:   Stress dose steroids.  NEUROLOGIC  A:  Acute encephalopathy 2nd to sepsis, shock, hypoxia. Improved 12/25. P:   Monitor clinically.  CC time 35 min  Alyson Reedy, M.D. [facility] Pulmonary/Critical Care Medicine. Pager: [phone]. After hours pager: [phone].

## 2012-01-21 NOTE — Progress Notes (Signed)
Patient Name: [name] Green Date of Encounter: 01/21/2012     Principal Problem:  *ST elevation MI (STEMI) - 2 sequential bifurcation (Prox & Mid LAD) 2 overlapping BMS  Active Problems:  Hypertension  GI bleed -- history of  Chronic anemia  Acute respiratory failure  Shock  Acute renal failure  Atrial fibrillation    SUBJECTIVE  She says she got so excited about feeling better that she did not get any sleep last night.  Denies chest or arm pain, and overall feels some better.    CURRENT MEDS    . antiseptic oral rinse  15 mL Mouth Rinse BID  . aspirin  81 mg Oral Daily  . clopidogrel  75 mg Oral Q breakfast  . enoxaparin (LOVENOX) injection  30 mg Subcutaneous Q24H  . hydrocortisone sodium succinate  50 mg Intravenous Q6H  . levofloxacin (LEVAQUIN) IV  500 mg Intravenous Q48H  . pantoprazole  40 mg Oral Q1200    OBJECTIVE  Filed Vitals:   01/21/12 0100 01/21/12 0200 01/21/12 0300 01/21/12 0400  BP: 117/53 111/58 111/52 106/42  Pulse: 82 80 83 79  Temp:    97.8 F (36.6 C)  TempSrc:    Oral  Resp: 18 19 17 18   Height:      Weight:      SpO2: 98% 99% 98% 98%    Intake/Output Summary (Last 24 hours) at 01/21/12 0652 Last data filed at 01/21/12 0400  Gross per 24 hour  Intake 2475.64 ml  Output    525 ml  Net 1950.64 ml   Filed Weights   01/17/12 1100 01/17/12 1500 01/20/12 0441  Weight: 141 lb (63.957 kg) 149 lb 7.6 oz (67.8 kg) 149 lb 11.1 oz (67.9 kg)    PHYSICAL EXAM  CVP: 7-9  General: Elderly, somewhat pale female in no acute distress.   Neuro: Alert and oriented X 3. Moves all extremities spontaneously. Psych: Normal affect. HEENT:  Normal  Neck: Supple without bruits or JVD. Lungs:  Resp regular and unlabored, decrease BS left base with soft crackles at right base Heart: RRR.  LLSE systolic ejection murmur, unchanged from yesterday.   Abdomen: Soft, non-tender, non-distended, BS + x 4.  Extremities: No clubbing, cyanosis or edema.  DP/PT/Radials 2+ and equal bilaterally.  Accessory Clinical Findings  CBC  Basename 01/21/12 0500 01/20/12 0430  WBC 15.1* 18.5*  NEUTROABS -- --  HGB 7.9* 8.6*  HCT 24.0* 26.5*  MCV 82.5 84.7  PLT 199 275   Basic Metabolic Panel  Basename 01/21/12 0500 01/20/12 0430 01/19/12 1305  NA 139 138 --  K 3.5 3.7 --  CL 108 105 --  CO2 19 16* --  GLUCOSE 134* 155* --  BUN 31* 33* --  CREATININE 1.41* 1.45* --  CALCIUM 8.4 8.6 --  MG -- 1.8 1.8  PHOS -- 3.0 3.2   Liver Function Tests No results found for this basename: AST:2,ALT:2,ALKPHOS:2,BILITOT:2,PROT:2,ALBUMIN:2 in the last 72 hours No results found for this basename: LIPASE:2,AMYLASE:2 in the last 72 hours Cardiac Enzymes No results found for this basename: CKTOTAL:3,CKMB:3,CKMBINDEX:3,TROPONINI:3 in the last 72 hours BNP No components found with this basename: POCBNP:3 D-Dimer No results found for this basename: DDIMER:2 in the last 72 hours Hemoglobin A1C No results found for this basename: HGBA1C in the last 72 hours Fasting Lipid Panel No results found for this basename: CHOL,HDL,LDLCALC,TRIG,CHOLHDL,LDLDIRECT in the last 72 hours Thyroid Function Tests No results found for this basename: TSH,T4TOTAL,FREET3,T3FREE,THYROIDAB in the last 72 hours  Radiology/Studies  Dg Chest Port 1 View  01/20/2012  *RADIOLOGY REPORT*  Clinical Data: Evaluate endotracheal tube position  PORTABLE CHEST - 1 VIEW  Comparison: Portable chest x-ray of 01/19/2012  Findings: The lungs appear better aerated.  Opacity remains at the left lung base consistent with atelectasis and effusion although pneumonia cannot be excluded.  There may be a small right pleural effusion present as well.  No endotracheal tube is seen.  A left central venous line tip overlies the lower SVC.  IMPRESSION: Improved aeration.  Persistent left basilar opacity consistent with atelectasis, effusion, and possibly pneumonia.   Original Report Authenticated By: Dwyane Dee, M.D.    Dg Chest Port 1 View  01/19/2012  *RADIOLOGY REPORT*  Clinical Data: Heart failure, confirm central line placement  PORTABLE CHEST - 1 VIEW  Comparison: 01/18/2012  Findings: Left internal jugular central venous line has been placed.  Tip projects over the anticipated position of the superior vena cava just above the cavoatrial junction.  There is no pneumothorax.  Stable mild left lower lobe consolidation.  There is also new mild right lower lobe atelectasis.  No definite pulmonary edema.  IMPRESSION: Left IJ central line placed with no pneumothorax.   Original Report Authenticated By: Esperanza Heir, M.D.    Dg Chest Port 1v Same Day  01/18/2012  *RADIOLOGY REPORT*  Clinical Data: Myocardial infarction.  PORTABLE CHEST - 1 VIEW SAME DAY  Comparison: No priors.  Findings: Volumes are normal.  Consolidation in the left lower lobe with a small left pleural effusion.  Right lung is clear. Pulmonary vasculature is within normal limits.  Heart size is mildly enlarged.  Upper mediastinal contours are unremarkable. Atherosclerosis in the thoracic aorta.  IMPRESSION: 1.  Left lower lobe consolidation concerning for pneumonia with small left parapneumonic pleural effusion. 2.  Mild cardiomegaly. 3.  Atherosclerosis.   Original Report Authenticated By: Trudie Reed, M.D.     ASSESSMENT AND PLAN   1.  SP anterior MI treated with PCI with patent IRA at follow up cath one day later.  2.  CKD, stage 3, impacted by renal hypoperfusion. 3.  Blood loss anemia  (groins stable, no hematoma) due to hydration, blood draws, procedures, etc. 4.  Persistent hypotension requiring IV hydration and pressors 5.  Possible HCAPS 6.  UTI noted on admission urinalysis.  7.  Atrial fibrillation requiring IV amiodarone.  Plan  1.  With Hgb under 8, history of GI bleed, need for anticoagulation as noted, persistent hypotension requiring pressors will transfuse for volume, cardiac, and hemodynamic support.     2.  Continue to monitor in unit with antibiotic plan per CCM. 3.  Taper pressors after transfusion. 4.  Plan for switch to PO amiodarone tomorrow on short term basis.    3.    Signed, [name] MD, [facility], [facility]

## 2012-01-21 NOTE — Progress Notes (Signed)
CARDIAC REHAB PHASE I   PRE:  Rate/Rhythm: 105 ST    BP: sitting 106/65    SaO2: 96 2L, 95 RA  MODE:  Ambulation: 100 ft   POST:  Rate/Rhythm: 129 ST with PACs    BP: sitting 132/84     SaO2: 95 RA  Used RW, assist x2. Steady, tolerated fairly well considering first time up in days. HR up, pt dyspneic. Had to force her to rest, very determined. Hard to get BP after walk but seemed to increase, stable. Return to bed. Should walk again this pm. Will f/u tomorrow.  [id number]  [name] Green CES, ACSM

## 2012-01-21 NOTE — Progress Notes (Signed)
  Amiodarone Drug - Drug Interaction Consult Note  Recommendations: Monitor for QT prolongation.  Amiodarone is metabolized by the cytochrome P450 system and therefore has the potential to cause many drug interactions. Amiodarone has an average plasma half-life of 50 days (range 20 to 100 days).   There is potential for drug interactions to occur several weeks or months after stopping treatment and the onset of drug interactions may be slow after initiating amiodarone.   []  Statins: Increased risk of myopathy. Simvastatin- restrict dose to 20mg  daily. Other statins: counsel patients to report any muscle pain or weakness immediately.  []  Anticoagulants: Amiodarone can increase anticoagulant effect. Consider warfarin dose reduction. Patients should be monitored closely and the dose of anticoagulant altered accordingly, remembering that amiodarone levels take several weeks to stabilize.  []  Antiepileptics: Amiodarone can increase plasma concentration of phenytoin, phenytoin dose should be reduced. Note that small changes in phenytoin dose can result in large changes in phenytoin levels. Monitor patient closely and counsel on signs of toxicity.  []  Beta blockers: increased risk of bradycardia, AV block and myocardial depression. Sotalol - avoid concomitant use.  []   Calcium channel blockers (diltiazem and verapamil): increased risk of bradycardia, AV block and myocardial depression.  []   Cyclosporine: Amiodarone increases levels of cyclosporine. Reduced dose of cyclosporine is recommended.  []  Digoxin dose should be halved when amiodarone is started.  []  Diuretics: increased risk of cardiotoxicity if hypokalemia occurs.  []  Oral hypoglycemic agents (glyburide, glipizide, glimepiride): increased risk of hypoglycemia. Patient's glucose levels should be monitored closely when initiating amiodarone therapy.   [x]  Drugs that prolong the QT interval: Concurrent therapy is contraindicated due to the  increased risk of torsades de pointes; . Antibiotics: e.g. fluoroquinolones, erythromycin. . Antiarrhythmics: e.g. quinidine, procainamide, disopyramide, sotalol. . Antipsychotics: e.g. phenothiazines, haloperidol.  . Lithium, tricyclic antidepressants, and methadone. Thank You,  Bola A. Wandra Feinstein D Clinical Pharmacist Pager:[phone] Phone [phone] 01/21/2012 10:41 AM

## 2012-01-22 ENCOUNTER — Inpatient Hospital Stay (HOSPITAL_COMMUNITY): Payer: Medicare Other

## 2012-01-22 LAB — TYPE AND SCREEN
Antibody Screen: NEGATIVE
Unit division: 0

## 2012-01-22 LAB — BASIC METABOLIC PANEL
Calcium: 8.8 mg/dL (ref 8.4–10.5)
Creatinine, Ser: 1.51 mg/dL — ABNORMAL HIGH (ref 0.50–1.10)
GFR calc non Af Amer: 31 mL/min — ABNORMAL LOW (ref >90)
Glucose, Bld: 131 mg/dL — ABNORMAL HIGH (ref 70–99)
Sodium: 140 mEq/L (ref 135–145)

## 2012-01-22 LAB — CBC
Hemoglobin: 9.4 g/dL — ABNORMAL LOW (ref 12.0–15.0)
MCH: 27.6 pg (ref 26.0–34.0)
MCHC: 33.6 g/dL (ref 30.0–36.0)
MCV: 82.4 fL (ref 78.0–100.0)
Platelets: 205 10*3/uL (ref 150–400)

## 2012-01-22 LAB — MAGNESIUM: Magnesium: 2.1 mg/dL (ref 1.5–2.5)

## 2012-01-22 LAB — PHOSPHORUS: Phosphorus: 3 mg/dL (ref 2.3–4.6)

## 2012-01-22 MED ORDER — ZOLPIDEM TARTRATE 5 MG PO TABS
5.0000 mg | ORAL_TABLET | Freq: Once | ORAL | Status: AC
  Administered 2012-01-22: 5 mg via ORAL
  Filled 2012-01-22: qty 1

## 2012-01-22 MED ORDER — AMIODARONE HCL 200 MG PO TABS
200.0000 mg | ORAL_TABLET | Freq: Two times a day (BID) | ORAL | Status: DC
  Administered 2012-01-22 (×2): 200 mg via ORAL
  Filled 2012-01-22 (×4): qty 1

## 2012-01-22 MED ORDER — DIGOXIN 0.25 MG/ML IJ SOLN: 0.2500 mg | Freq: Once | INTRAMUSCULAR | Status: DC

## 2012-01-22 MED ORDER — IPRATROPIUM BROMIDE 0.02 % IN SOLN
0.5000 mg | RESPIRATORY_TRACT | Status: DC | PRN
  Administered 2012-01-22 – 2012-01-27 (×4): 0.5 mg via RESPIRATORY_TRACT
  Filled 2012-01-22 (×4): qty 2.5

## 2012-01-22 MED ORDER — ALBUTEROL SULFATE (5 MG/ML) 0.5% IN NEBU
2.5000 mg | INHALATION_SOLUTION | RESPIRATORY_TRACT | Status: DC | PRN
  Administered 2012-01-22 – 2012-01-27 (×4): 2.5 mg via RESPIRATORY_TRACT
  Filled 2012-01-22 (×4): qty 0.5

## 2012-01-22 MED ORDER — FUROSEMIDE 10 MG/ML IJ SOLN: 40.0000 mg | Freq: Four times a day (QID) | INTRAMUSCULAR | Status: DC

## 2012-01-22 MED ORDER — FUROSEMIDE 10 MG/ML IJ SOLN
40.0000 mg | Freq: Three times a day (TID) | INTRAMUSCULAR | Status: AC
  Administered 2012-01-22 (×2): 40 mg via INTRAVENOUS
  Filled 2012-01-22: qty 4

## 2012-01-22 MED ORDER — DIGOXIN 0.25 MG/ML IJ SOLN
0.2500 mg | INTRAMUSCULAR | Status: AC
  Administered 2012-01-23: 0.25 mg via INTRAVENOUS
  Filled 2012-01-22: qty 1

## 2012-01-22 MED ORDER — FUROSEMIDE 20 MG PO TABS
20.0000 mg | ORAL_TABLET | Freq: Once | ORAL | Status: AC
  Administered 2012-01-22: 20 mg via ORAL
  Filled 2012-01-22: qty 1

## 2012-01-22 MED ORDER — DIGOXIN 0.25 MG/ML IJ SOLN
0.2500 mg | Freq: Once | INTRAMUSCULAR | Status: AC
  Administered 2012-01-23: 0.25 mg via INTRAVENOUS
  Filled 2012-01-22 (×2): qty 1

## 2012-01-22 MED ORDER — DIGOXIN 0.0625 MG HALF TABLET
0.0625 mg | ORAL_TABLET | Freq: Every day | ORAL | Status: DC
  Administered 2012-01-23 – 2012-01-24 (×2): 0.0625 mg via ORAL
  Filled 2012-01-22 (×3): qty 1

## 2012-01-22 NOTE — Progress Notes (Signed)
eLink Physician-Brief Progress Note Patient Name: Kaitlan Bin DOB: Feb 12, 1925 MRN: [id number]  Date of Service  01/22/2012   HPI/Events of Note  Call from nurse reporting that patient SOB with expiratory wheezing and sounding "tight".  In no acute distress with sats maintained at 95 to 96% with RR in the 20s and no excessive WOB.  Also c/o of insomnia and requesting medication for sleep.   eICU Interventions  Plan: PRN order for albuterol/atrovent neb One time dose of ambien 5 mg po   Intervention Category Intermediate Interventions: Respiratory distress - evaluation and management Minor Interventions: Other: (insomnia)  DETERDING,ELIZABETH 01/22/2012, 12:22 AM

## 2012-01-22 NOTE — Progress Notes (Signed)
CARDIAC REHAB PHASE I   PRE:  Rate/Rhythm: 96 SR    BP: sitting 112/60    SaO2: 91 RA  MODE:  Ambulation: 10 ft, return to room when HR up, SaO2 down   POST:  Rate/Rhythm: 131     SaO2: 87 RA  MODE:  Ambulation: 200 ft   POST:  Rate/Rhythm: 122 ST    BP: sitting 120/60     SaO2: 95 2L  Pt c/o "spot" midsternum that feels tight, like she needs to cough up phlegm. No production. SaO2 lower and HR higher today. Required O2 after a few feet. Felt some better on O2 but still DOE, trouble talking during walk. Forced pt to rest and not talk. SaO2 stable, HR up to 122. Exhausted after walk. Did not tolerate as well as yesterday. Kept O2 on in recliner.  [id number]     Harriet Masson CES, ACSM

## 2012-01-22 NOTE — Progress Notes (Addendum)
Name: Analeigha Nauman MRN: [id number] DOB: 1925-10-09    LOS: 5  Referring Provider:  Cardiology Reason for Referral:  Sepsis  PULMONARY / CRITICAL CARE MEDICINE  Brief patient description:  78 year old with STEMI s/p stent placement, and developed evolving LLL infiltrate with shock.  PCCM consulted 12/24.  Current Status:  Denies chest pain.  Still has cough, but not much sputum.  Not much appetite.  Vital Signs: Temp:  [97.2 F (36.2 C)-98.9 F (37.2 C)] 98.9 F (37.2 C) (12/27 0800) Pulse Rate:  [79-111] 104  (12/27 1000) Resp:  [15-21] 16  (12/27 0400) BP: (93-132)/(43-82) 103/63 mmHg (12/27 1000) SpO2:  [93 %-99 %] 94 % (12/27 1000)  JY = BFO Physical Examination: General: No distress Neuro:  Normal strength HEENT: No sinus tenderness Cardiovascular:  s1s2 tachycardic, no murmur Lungs:  Scattered rhonchi, no wheeze Abdomen:  Soft, non tender Musculoskeletal: No edema Skin:  No rashes  Imaging: Dg Chest Port 1 View  01/22/2012  *RADIOLOGY REPORT*  Clinical Data: Respiratory failure  PORTABLE CHEST - 1 VIEW  Comparison: Prior chest x-ray 01/21/2012  Findings: Unchanged position of left IJ approach central venous catheter.  Catheter tip projects over the superior cavoatrial junction.  Unchanged cardiomegaly.  Slightly increased pulmonary vascular congestion and mild edema.  Slightly increased bilateral layering pleural effusions and associated bibasilar opacities. Atherosclerotic vascular calcifications again noted throughout the aorta.  Probable calcified 1.5 cm splenic artery aneurysm. No acute osseous abnormality.  IMPRESSION:  1.  Slight interval increase in pulmonary vascular congestion, interstitial edema and bilateral layering effusions suggesting increasing CHF versus volume overload. 2.  Increased bibasilar opacities likely reflective of increased pleural fluid combined with atelectasis.  Superimposed infection difficult to exclude radiographically. 3.  Probable 1.5 cm  calcified splenic artery aneurysm. 4.  Stable and satisfactory left IJ central venous catheter.   Original Report Authenticated By: Malachy Moan, M.D.    Dg Chest Port 1 View  01/21/2012  *RADIOLOGY REPORT*  Clinical Data: Follow up pulmonary infiltrates.  PORTABLE CHEST - 1 VIEW  Comparison: One-view chest 01/20/2012  Findings: The heart size is exaggerated by low lung volumes.  The left IJ line is stable.  A left pleural effusion has slightly increased.  Bilateral pulmonary vascular congestion is more conspicuous.  Bibasilar airspace disease has increased slightly, worse on the left.  IMPRESSION:  1.  Increasing pulmonary vascular congestion and bilateral pleural effusions, suggesting congestive heart failure. 2.  Increasing bibasilar airspace disease, left greater than right. While this likely represents atelectasis, infection is not excluded.   Original Report Authenticated By: Marin Roberts, M.D.    Principal Problem:  *ST elevation MI (STEMI) - 2 sequential bifurcation (Prox & Mid LAD) 2 overlapping BMS  Active Problems:  Hypertension  GI bleed -- history of  Chronic anemia  Acute respiratory failure  Shock  Acute renal failure  Atrial fibrillation  ASSESSMENT AND PLAN  PULMONARY  Lab 01/17/12 1253  PHART 7.335*  PCO2ART 36.1  PO2ART 57.0*  HCO3 19.3*  O2SAT 88.0    ETT:  None  A:  Acute hypoxic respiratory failure 2nd to PNA and pulmonary edema. P:   F/u CXR Adjust oxygen to keep SpO2 > 92% Start diureses today that patient is off pressors.  CARDIOVASCULAR  Lab 01/18/12 0205 01/17/12 2013 01/17/12 1515  TROPONINI >20.00* >20.00* >20.00*  LATICACIDVEN -- -- --  PROBNP -- -- --   Lines: L IJ TLC 12/24>>>  A: Shock>>cardiogenic, septic, and relative adrenal insufficiency. STEMI.  Acute systolic CHF. Atrial fibrillation. P:  D/C levophed. Fluids for goal CVP > 6. Continue solucortef, if BP remains stable will begin tapering in AM. Amiodarone per  cardiology. Continue ASA, plavix.  RENAL  Lab 01/22/12 0500 01/21/12 0500 01/20/12 0430 01/19/12 1600 01/19/12 1305  NA 140 139 138 137 137  K 4.2 3.5 -- -- --  CL 109 108 105 105 106  CO2 21 19 16* 20 19  BUN 31* 31* 33* 28* 28*  CREATININE 1.51* 1.41* 1.45* 1.34* 1.35*  CALCIUM 8.8 8.4 8.6 8.7 8.6  MG 2.1 -- 1.8 -- 1.8  PHOS 3.0 -- 3.0 -- 3.2   Intake/Output      12/26 0701 - 12/27 0700 12/27 0701 - 12/28 0700   P.O. 295 300   I.V. (mL/kg) 1164.2 (17.1) 73.4 (1.1)   Blood 350    IV Piggyback     Total Intake(mL/kg) 1809.2 (26.6) 373.4 (5.5)   Urine (mL/kg/hr) 725 (0.4) 300 (0.9)   Total Output 725 300   Net +1084.2 +73.4          Intake/Output Summary (Last 24 hours) at 01/22/12 1157 Last data filed at 01/22/12 1000  Gross per 24 hour  Intake 1700.8 ml  Output    775 ml  Net  925.8 ml   Foley:  12/23>>>  A:  Acute renal insufficiency. Improved with pressors, volume resuscitation. P:   Monitor renal fx, urine outpt, electrolytes Lasix as ordered. BMET in AM.  GASTROINTESTINAL No results found for this basename: AST:5,ALT:5,ALKPHOS:5,BILITOT:5,PROT:5,ALBUMIN:5 in the last 168 hours  A:  Nutrition. P:   Encourage po intake  HEMATOLOGIC  Lab 01/22/12 0500 01/21/12 0500 01/20/12 0430 01/19/12 0432 01/18/12 1718  HGB 9.4* 7.9* 8.6* 9.5* 10.1*  HCT 28.0* 24.0* 26.5* 29.8* 30.5*  PLT 205 199 275 201 179  INR -- -- -- -- --  APTT -- -- -- -- --   A:  Anemia of critical illness. P:  F/u CBC Transfuse for Hb < 8 in setting of ACS  INFECTIOUS  Lab 01/22/12 0500 01/21/12 0500 01/20/12 0430 01/19/12 0432 01/18/12 1718  WBC 11.1* 15.1* 18.5* 19.1* 17.9*  PROCALCITON -- -- -- -- --   Cultures: Blood 12/24>>> Urine 12/24>>>E coli 60K colonies Sputum 12/24>>>  Antibiotics: Levofloxacin 12/23>>>  A:  PNA. E coli UTI. P:   Continue levaquin for a 7 day period.  ENDOCRINE No results found for this basename: GLUCAP:5 in the last 168 hours A:   Relative adrenal insufficiency in setting of sepsis.   P:   Stress dose steroids.  NEUROLOGIC  A:  Acute encephalopathy 2nd to sepsis, shock, hypoxia. Improved 12/25. P:   Monitor clinically.  Will transfer to SDU and actively diurese today. PCCM will sign off, please call back if needed.  Alyson Reedy, M.D. [facility] Pulmonary/Critical Care Medicine. Pager: [phone]. After hours pager: [phone].

## 2012-01-22 NOTE — Progress Notes (Signed)
Pt OOB to BSC, oxygen removed, upon trying to get back to bed pt became dyspenic with audible expiratory wheezes. Pt helped back to bed and oxygen placed back on pt at 2L [place]. Elink MD made aware and new orders received. Will continue to monitor closely.

## 2012-01-22 NOTE — Progress Notes (Addendum)
Patient Name: [name] Green Date of Encounter: 01/22/2012     Principal Problem:  *ST elevation MI (STEMI) - 2 sequential bifurcation (Prox & Mid LAD) 2 overlapping BMS  Active Problems:  Hypertension  GI bleed -- history of  Chronic anemia  Acute respiratory failure  Shock  Acute renal failure  Atrial fibrillation    SUBJECTIVE  Clinically better.  Perhaps slight confusion last pm.  Good day yesterday.  Tolerated blood administration.    CURRENT MEDS    . antiseptic oral rinse  15 mL Mouth Rinse BID  . aspirin  81 mg Oral Daily  . clopidogrel  75 mg Oral Q breakfast  . enoxaparin (LOVENOX) injection  30 mg Subcutaneous Q24H  . hydrocortisone sodium succinate  50 mg Intravenous Q6H  . levofloxacin (LEVAQUIN) IV  500 mg Intravenous Q48H  . pantoprazole  40 mg Oral Q1200    OBJECTIVE  Filed Vitals:   01/22/12 0300 01/22/12 0400 01/22/12 0500 01/22/12 0600  BP: 112/60 108/49 93/59 96/48   Pulse: 94 87 81 81  Temp:  97.7 F (36.5 C)    TempSrc:  Oral    Resp:  16    Height:      Weight:      SpO2: 98% 99% 99% 96%    Intake/Output Summary (Last 24 hours) at 01/22/12 0800 Last data filed at 01/22/12 0600  Gross per 24 hour  Intake 1698.3 ml  Output    475 ml  Net 1223.3 ml   Filed Weights   01/17/12 1100 01/17/12 1500 01/20/12 0441  Weight: 141 lb (63.957 kg) 149 lb 7.6 oz (67.8 kg) 149 lb 11.1 oz (67.9 kg)    PHYSICAL EXAM  General: Pleasant, NAD. Neuro: Alert and oriented X 3. Moves all extremities spontaneously. Psych: Normal affect. HEENT:  Normal  Neck: Supple without bruits or JVD. Lungs:  Bibasilar crackles.  Heart: RRR.  SEM at LSE.   Abdomen: Soft, non-tender, non-distended, BS + x 4.  Extremities: No clubbing, cyanosis or edema. DP/PT/Radials 2+ and equal bilaterally.  Accessory Clinical Findings  CBC  Basename 01/22/12 0500 01/21/12 0500  WBC 11.1* 15.1*  NEUTROABS -- --  HGB 9.4* 7.9*  HCT 28.0* 24.0*  MCV 82.4 82.5  PLT 205  199   Basic Metabolic Panel  Basename 01/22/12 0500 01/21/12 0500 01/20/12 0430  NA 140 139 --  K 4.2 3.5 --  CL 109 108 --  CO2 21 19 --  GLUCOSE 131* 134* --  BUN 31* 31* --  CREATININE 1.51* 1.41* --  CALCIUM 8.8 8.4 --  MG 2.1 -- 1.8  PHOS 3.0 -- 3.0   Liver Function Tests No results found for this basename: AST:2,ALT:2,ALKPHOS:2,BILITOT:2,PROT:2,ALBUMIN:2 in the last 72 hours No results found for this basename: LIPASE:2,AMYLASE:2 in the last 72 hours Cardiac Enzymes No results found for this basename: CKTOTAL:3,CKMB:3,CKMBINDEX:3,TROPONINI:3 in the last 72 hours BNP No components found with this basename: POCBNP:3 D-Dimer No results found for this basename: DDIMER:2 in the last 72 hours Hemoglobin A1C No results found for this basename: HGBA1C in the last 72 hours Fasting Lipid Panel No results found for this basename: CHOL,HDL,LDLCALC,TRIG,CHOLHDL,LDLDIRECT in the last 72 hours Thyroid Function Tests No results found for this basename: TSH,T4TOTAL,FREET3,T3FREE,THYROIDAB in the last 72 hours    Radiology/Studies  Dg Chest Port 1 View  01/22/2012  *RADIOLOGY REPORT*  Clinical Data: Respiratory failure  PORTABLE CHEST - 1 VIEW  Comparison: Prior chest x-ray 01/21/2012  Findings: Unchanged position of left IJ approach  central venous catheter.  Catheter tip projects over the superior cavoatrial junction.  Unchanged cardiomegaly.  Slightly increased pulmonary vascular congestion and mild edema.  Slightly increased bilateral layering pleural effusions and associated bibasilar opacities. Atherosclerotic vascular calcifications again noted throughout the aorta.  Probable calcified 1.5 cm splenic artery aneurysm. No acute osseous abnormality.  IMPRESSION:  1.  Slight interval increase in pulmonary vascular congestion, interstitial edema and bilateral layering effusions suggesting increasing CHF versus volume overload. 2.  Increased bibasilar opacities likely reflective of increased  pleural fluid combined with atelectasis.  Superimposed infection difficult to exclude radiographically. 3.  Probable 1.5 cm calcified splenic artery aneurysm. 4.  Stable and satisfactory left IJ central venous catheter.   Original Report Authenticated By: Malachy Moan, M.D.    Dg Chest Port 1 View  01/21/2012  *RADIOLOGY REPORT*  Clinical Data: Follow up pulmonary infiltrates.  PORTABLE CHEST - 1 VIEW  Comparison: One-view chest 01/20/2012  Findings: The heart size is exaggerated by low lung volumes.  The left IJ line is stable.  A left pleural effusion has slightly increased.  Bilateral pulmonary vascular congestion is more conspicuous.  Bibasilar airspace disease has increased slightly, worse on the left.  IMPRESSION:  1.  Increasing pulmonary vascular congestion and bilateral pleural effusions, suggesting congestive heart failure. 2.  Increasing bibasilar airspace disease, left greater than right. While this likely represents atelectasis, infection is not excluded.   Original Report Authenticated By: Marin Roberts, M.D.    Dg Chest Port 1 View  01/20/2012  *RADIOLOGY REPORT*  Clinical Data: Evaluate endotracheal tube position  PORTABLE CHEST - 1 VIEW  Comparison: Portable chest x-ray of 01/19/2012  Findings: The lungs appear better aerated.  Opacity remains at the left lung base consistent with atelectasis and effusion although pneumonia cannot be excluded.  There may be a small right pleural effusion present as well.  No endotracheal tube is seen.  A left central venous line tip overlies the lower SVC.  IMPRESSION: Improved aeration.  Persistent left basilar opacity consistent with atelectasis, effusion, and possibly pneumonia.   Original Report Authenticated By: Dwyane Dee, M.D.    Dg Chest Port 1 View  01/19/2012  *RADIOLOGY REPORT*  Clinical Data: Heart failure, confirm central line placement  PORTABLE CHEST - 1 VIEW  Comparison: 01/18/2012  Findings: Left internal jugular central venous  line has been placed.  Tip projects over the anticipated position of the superior vena cava just above the cavoatrial junction.  There is no pneumothorax.  Stable mild left lower lobe consolidation.  There is also new mild right lower lobe atelectasis.  No definite pulmonary edema.  IMPRESSION: Left IJ central line placed with no pneumothorax.   Original Report Authenticated By: Esperanza Heir, M.D.    Dg Chest Port 1v Same Day  01/18/2012  *RADIOLOGY REPORT*  Clinical Data: Myocardial infarction.  PORTABLE CHEST - 1 VIEW SAME DAY  Comparison: No priors.  Findings: Volumes are normal.  Consolidation in the left lower lobe with a small left pleural effusion.  Right lung is clear. Pulmonary vasculature is within normal limits.  Heart size is mildly enlarged.  Upper mediastinal contours are unremarkable. Atherosclerosis in the thoracic aorta.  IMPRESSION: 1.  Left lower lobe consolidation concerning for pneumonia with small left parapneumonic pleural effusion. 2.  Mild cardiomegaly. 3.  Atherosclerosis.   Original Report Authenticated By: Trudie Reed, M.D.     ASSESSMENT AND PLAN  Anterior MI SP PCI of the LAD Probably CAP Chronic anemia SP transfusion. Paroxysmal  atrial fibrillation.      Plan  1.  Transition IV amio to PO 2.  Furosemide  -- one dose PO  (CVP now 10) 3.  Up in chair. 4.  Watch BP.   5.   Up in chair  More than 30 minutes of contact time.    Signed, [name] MD, [facility], FSCAI

## 2012-01-22 NOTE — Progress Notes (Addendum)
Pt OOB to BSC. Pt spontaneously converted from NSR to A Fib with RVR HR 150-170s, all other VSS. Pt returned to bed, vagal manuevers done HR would decrease to 130s then return directly back to 145-160s, EKG done-confirmed A. Fib with RVR. Pt states "I got all excited because I had to go to the bathroom all fast." Pt without complaints at this time. PO Amiodarone given. MD paged and made aware. New orders received, will continue to monitor closely.

## 2012-01-22 NOTE — Progress Notes (Signed)
Pt back in AF on amio.   She is currently off pressors.  Will dig load to try and help rate control.  Pt asymptomatic.  BP stable.  Discussed dosing with Tammy Sours from pharmacy:  0.25 IV x 2 q6 h, then 0.0625 mg PO daily, dig level tomorrow am and then again in 3 days

## 2012-01-23 LAB — BASIC METABOLIC PANEL
CO2: 23 mEq/L (ref 19–32)
Glucose, Bld: 114 mg/dL — ABNORMAL HIGH (ref 70–99)
Potassium: 3.8 mEq/L (ref 3.5–5.1)
Sodium: 142 mEq/L (ref 135–145)

## 2012-01-23 LAB — CBC
Hemoglobin: 10.2 g/dL — ABNORMAL LOW (ref 12.0–15.0)
MCH: 27.3 pg (ref 26.0–34.0)
RBC: 3.74 MIL/uL — ABNORMAL LOW (ref 3.87–5.11)

## 2012-01-23 LAB — PHOSPHORUS: Phosphorus: 2.9 mg/dL (ref 2.3–4.6)

## 2012-01-23 LAB — DIGOXIN LEVEL: Digoxin Level: 0.6 ng/mL — ABNORMAL LOW (ref 0.8–2.0)

## 2012-01-23 MED ORDER — AMIODARONE HCL 200 MG PO TABS
400.0000 mg | ORAL_TABLET | Freq: Three times a day (TID) | ORAL | Status: DC
  Administered 2012-01-23 – 2012-01-25 (×8): 400 mg via ORAL
  Filled 2012-01-23 (×13): qty 2

## 2012-01-23 MED ORDER — AMIODARONE HCL 200 MG PO TABS
200.0000 mg | ORAL_TABLET | Freq: Three times a day (TID) | ORAL | Status: DC
  Filled 2012-01-23 (×3): qty 1

## 2012-01-23 MED ORDER — METOPROLOL TARTRATE 25 MG PO TABS
25.0000 mg | ORAL_TABLET | Freq: Four times a day (QID) | ORAL | Status: DC
  Administered 2012-01-23 – 2012-01-24 (×4): 25 mg via ORAL
  Filled 2012-01-23 (×8): qty 1

## 2012-01-23 NOTE — Progress Notes (Signed)
SUBJECTIVE: The patient is doing well today.  At this time, she denies chest pain, shortness of breath, or any new concerns. She returned to afib overnight.    Marland Kitchen amiodarone  400 mg Oral TID  . antiseptic oral rinse  15 mL Mouth Rinse BID  . aspirin  81 mg Oral Daily  . clopidogrel  75 mg Oral Q breakfast  . digoxin  0.0625 mg Oral Daily  . enoxaparin (LOVENOX) injection  30 mg Subcutaneous Q24H  . hydrocortisone sodium succinate  50 mg Intravenous Q6H  . levofloxacin (LEVAQUIN) IV  500 mg Intravenous Q48H  . metoprolol tartrate  25 mg Oral Q6H  . pantoprazole  40 mg Oral Q1200      . sodium chloride 20 mL/hr (01/21/12 0900)    OBJECTIVE: Physical Exam: Filed Vitals:   01/23/12 0400 01/23/12 0403 01/23/12 0753 01/23/12 0755  BP: 113/83   115/73  Pulse:      Temp:  97.7 F (36.5 C) 97.5 F (36.4 C)   TempSrc:  Oral Oral   Resp: 16  16   Height:      Weight:      SpO2: 94%   94%    Intake/Output Summary (Last 24 hours) at 01/23/12 1610 Last data filed at 01/23/12 0400  Gross per 24 hour  Intake    419 ml  Output   1675 ml  Net  -1256 ml    Telemetry reveals afib with elevated V rates this am  GEN- The patient is elderly appearing, alert and oriented x 3 today.   Head- normocephalic, atraumatic Eyes-  Sclera clear, conjunctiva pink Ears- hearing intact Oropharynx- clear Neck- supple, no JVP Lymph- no cervical lymphadenopathy Lungs- Clear to ausculation bilaterally, normal work of breathing Heart- irregular rate and rhythm, no murmurs, rubs or gallops, PMI not laterally displaced GI- soft, NT, ND, + BS Extremities- no clubbing, cyanosis, or edema Skin- no rash or lesion Psych- euthymic mood, full affect Neuro- strength and sensation are intact  LABS: Basic Metabolic Panel:  Basename 01/23/12 0550 01/22/12 0500  NA 142 140  K 3.8 4.2  CL 107 109  CO2 23 21  GLUCOSE 114* 131*  BUN 35* 31*  CREATININE 1.61* 1.51*  CALCIUM 8.9 8.8  MG 1.9 2.1    PHOS 2.9 3.0   Liver Function Tests: No results found for this basename: AST:2,ALT:2,ALKPHOS:2,BILITOT:2,PROT:2,ALBUMIN:2 in the last 72 hours No results found for this basename: LIPASE:2,AMYLASE:2 in the last 72 hours CBC:  Basename 01/23/12 0550 01/22/12 0500  WBC 10.4 11.1*  NEUTROABS -- --  HGB 10.2* 9.4*  HCT 31.1* 28.0*  MCV 83.2 82.4  PLT 227 205   ASSESSMENT AND PLAN:  Principal Problem:  *ST elevation MI (STEMI) - 2 sequential bifurcation (Prox & Mid LAD) 2 overlapping BMS  Active Problems:  Hypertension  GI bleed -- history of  Chronic anemia  Acute respiratory failure  Shock  Acute renal failure  Atrial fibrillation  1. Anterior MI SP PCI of the LAD  Continue ASA and plavix Add metoprolol  2. CAP Antibiotics per PCCM  3. Afib Increase amiodarone to 400mg  TID Digoxin added, though I suspect that this will add very little Will plan to stop digoxin in next 1-2 days Add metoprolol for rate control May need anticoagulation if afib persists though I would like to avoid this presently with anemia and dual antiplatelet therapy  4. Chronic anemia SP transfusion.  Follow hemaglobin  Continue to ambulate/ up to chair  Keep in TCU today due to afib with RVR  Hillis Range, MD 01/23/2012 9:38 AM

## 2012-01-23 NOTE — Progress Notes (Signed)
CARDIAC REHAB PHASE I   PRE:  Rate/Rhythm: 86 afib    BP: sitting 128/76    SaO2: 93 RA  MODE:  Ambulation: 120 ft   POST:  Rate/Rhythm: 129 afib    BP: sitting 142/69     SaO2: 100 2L  Pt reluctant to walk due to feeling SOB. Seemed more SOB in bed lying down, some better sitting up. Used RW, 2L O2 and assist x2. Steady, DOE. Forced pt to rest at half way then she decided to return to room. To recliner to breathe better. VSS, peak HR 130 afib when up.  [id number]  Harriet Masson CES, ACSM

## 2012-01-24 LAB — BASIC METABOLIC PANEL
Chloride: 108 mEq/L (ref 96–112)
GFR calc Af Amer: 37 mL/min — ABNORMAL LOW (ref >90)
Potassium: 4 mEq/L (ref 3.5–5.1)
Sodium: 142 mEq/L (ref 135–145)

## 2012-01-24 LAB — CBC
HCT: 29.3 % — ABNORMAL LOW (ref 36.0–46.0)
Hemoglobin: 9.3 g/dL — ABNORMAL LOW (ref 12.0–15.0)
RDW: 15 % (ref 11.5–15.5)
WBC: 13.1 10*3/uL — ABNORMAL HIGH (ref 4.0–10.5)

## 2012-01-24 MED ORDER — FUROSEMIDE 10 MG/ML IJ SOLN
40.0000 mg | Freq: Once | INTRAMUSCULAR | Status: AC
  Administered 2012-01-24: 40 mg via INTRAVENOUS
  Filled 2012-01-24: qty 4

## 2012-01-24 MED ORDER — MAGNESIUM HYDROXIDE 400 MG/5ML PO SUSP
30.0000 mL | Freq: Every day | ORAL | Status: DC | PRN
  Administered 2012-01-24 – 2012-02-01 (×4): 30 mL via ORAL
  Filled 2012-01-24 (×4): qty 30

## 2012-01-24 MED ORDER — HYDROCORTISONE 1 % EX CREA
TOPICAL_CREAM | Freq: Three times a day (TID) | CUTANEOUS | Status: DC | PRN
  Filled 2012-01-24: qty 28

## 2012-01-24 MED ORDER — METOPROLOL TARTRATE 25 MG PO TABS
37.5000 mg | ORAL_TABLET | Freq: Four times a day (QID) | ORAL | Status: DC
  Administered 2012-01-24 – 2012-01-25 (×3): 37.5 mg via ORAL
  Filled 2012-01-24 (×8): qty 1

## 2012-01-24 NOTE — Progress Notes (Addendum)
SUBJECTIVE: The patient is doing well today. SOB with activity.  HR at rest are better.  At this time, she denies chest pain, shortness of breath, or any new concerns.  She remains in afib since yesterday.    Marland Kitchen amiodarone  400 mg Oral TID  . antiseptic oral rinse  15 mL Mouth Rinse BID  . aspirin  81 mg Oral Daily  . clopidogrel  75 mg Oral Q breakfast  . digoxin  0.0625 mg Oral Daily  . enoxaparin (LOVENOX) injection  30 mg Subcutaneous Q24H  . furosemide  40 mg Intravenous Once  . hydrocortisone sodium succinate  50 mg Intravenous Q6H  . levofloxacin (LEVAQUIN) IV  500 mg Intravenous Q48H  . metoprolol tartrate  37.5 mg Oral Q6H  . pantoprazole  40 mg Oral Q1200      . sodium chloride 20 mL/hr (01/21/12 0900)    OBJECTIVE: Physical Exam: Filed Vitals:   01/23/12 2022 01/23/12 2336 01/24/12 0403 01/24/12 0751  BP: 127/66 115/59 139/56   Pulse: 98  83   Temp: 97.6 F (36.4 C) 97.8 F (36.6 C) 97.7 F (36.5 C) 97.9 F (36.6 C)  TempSrc: Oral Oral Oral Oral  Resp: 18 16 16 16   Height:      Weight:   155 lb 1.6 oz (70.353 kg)   SpO2: 94% 93% 91%     Intake/Output Summary (Last 24 hours) at 01/24/12 1004 Last data filed at 01/24/12 0600  Gross per 24 hour  Intake    240 ml  Output    300 ml  Net    -60 ml    Telemetry reveals afib with controlled V rates this am  GEN- The patient is elderly appearing, alert and oriented x 3 today.   Head- normocephalic, atraumatic Eyes-  Sclera clear, conjunctiva pink Ears- hearing intact Oropharynx- clear Neck- supple, no JVP, L neck CVL site looks good  Lymph- no cervical lymphadenopathy Lungs- few basilar rales, normal work of breathing Heart- irregular rate and rhythm, no murmurs, rubs or gallops, PMI not laterally displaced GI- soft, NT, ND, + BS Extremities- no clubbing, cyanosis, or edema Skin- no rash or lesion Psych- euthymic mood, full affect Neuro- strength and sensation are intact  LABS: Basic Metabolic  Panel:  Basename 01/24/12 0538 01/23/12 0550 01/22/12 0500  NA 142 142 --  K 4.0 3.8 --  CL 108 107 --  CO2 21 23 --  GLUCOSE 121* 114* --  BUN 43* 35* --  CREATININE 1.47* 1.61* --  CALCIUM 8.9 8.9 --  MG -- 1.9 2.1  PHOS -- 2.9 3.0   Liver Function Tests: No results found for this basename: AST:2,ALT:2,ALKPHOS:2,BILITOT:2,PROT:2,ALBUMIN:2 in the last 72 hours No results found for this basename: LIPASE:2,AMYLASE:2 in the last 72 hours CBC:  Basename 01/24/12 0538 01/23/12 0550  WBC 13.1* 10.4  NEUTROABS -- --  HGB 9.3* 10.2*  HCT 29.3* 31.1*  MCV 84.0 83.2  PLT 268 227   ASSESSMENT AND PLAN:  Principal Problem:  *ST elevation MI (STEMI) - 2 sequential bifurcation (Prox & Mid LAD) 2 overlapping BMS  Active Problems:  Hypertension  GI bleed -- history of  Chronic anemia  Acute respiratory failure  Shock  Acute renal failure  Atrial fibrillation  1. Anterior MI SP PCI of the LAD  Continue ASA and plavix and metoprolol  2. CAP Antibiotics per PCCM Will need to transition/ taper steroids soon.  3. Afib Continue amiodarone to 400mg  TID Digoxin added, though I  suspect that this will add very little Will plan to stop digoxin in next 1-2 days Rates are better controlled. Afib for only 24 hours at this point.  She may need anticoagulation if afib persists though I am reluctant to start this presently with anemia and dual antiplatelet therapy  4. Chronic anemia SP transfusion.  Follow hemaglobin  Continue to ambulate/ up to chair Transfer to telemetry  Hillis Range, MD 01/24/2012 10:04 AM

## 2012-01-25 LAB — CULTURE, BLOOD (ROUTINE X 2)

## 2012-01-25 LAB — BASIC METABOLIC PANEL
CO2: 25 mEq/L (ref 19–32)
GFR calc non Af Amer: 28 mL/min — ABNORMAL LOW (ref >90)
Glucose, Bld: 130 mg/dL — ABNORMAL HIGH (ref 70–99)
Potassium: 3.5 mEq/L (ref 3.5–5.1)
Sodium: 142 mEq/L (ref 135–145)

## 2012-01-25 LAB — CBC
Hemoglobin: 9.1 g/dL — ABNORMAL LOW (ref 12.0–15.0)
MCH: 27 pg (ref 26.0–34.0)
RBC: 3.37 MIL/uL — ABNORMAL LOW (ref 3.87–5.11)

## 2012-01-25 MED ORDER — ATORVASTATIN CALCIUM 20 MG PO TABS
20.0000 mg | ORAL_TABLET | Freq: Every day | ORAL | Status: DC
  Administered 2012-01-25 – 2012-02-02 (×9): 20 mg via ORAL
  Filled 2012-01-25 (×11): qty 1

## 2012-01-25 MED ORDER — METOPROLOL TARTRATE 50 MG PO TABS
50.0000 mg | ORAL_TABLET | Freq: Two times a day (BID) | ORAL | Status: DC
  Administered 2012-01-25 – 2012-02-03 (×12): 50 mg via ORAL
  Filled 2012-01-25 (×19): qty 1

## 2012-01-25 NOTE — Progress Notes (Signed)
   SUBJECTIVE: No chest pain or dyspnea    . amiodarone  400 mg Oral TID  . aspirin  81 mg Oral Daily  . clopidogrel  75 mg Oral Q breakfast  . digoxin  0.0625 mg Oral Daily  . enoxaparin (LOVENOX) injection  30 mg Subcutaneous Q24H  . hydrocortisone sodium succinate  50 mg Intravenous Q6H  . levofloxacin (LEVAQUIN) IV  500 mg Intravenous Q48H  . metoprolol tartrate  37.5 mg Oral Q6H  . pantoprazole  40 mg Oral Q1200      . sodium chloride 20 mL/hr (01/21/12 0900)    OBJECTIVE: Physical Exam: Filed Vitals:   01/24/12 1253 01/24/12 1614 01/24/12 1951 01/25/12 0443  BP: 116/66 119/65 113/45 125/58  Pulse: 86 81 79 70  Temp:   98.4 F (36.9 C) 97.5 F (36.4 C)  TempSrc:   Oral Oral  Resp: 18  19 19   Height:      Weight:      SpO2: 96%  94% 92%    Intake/Output Summary (Last 24 hours) at 01/25/12 0726 Last data filed at 01/25/12 0500  Gross per 24 hour  Intake      0 ml  Output    650 ml  Net   -650 ml    Telemetry reveals afib with controlled V rates this am  GEN- The patient is elderly appearing, alert and oriented x 3 today.   Head- normocephalic, atraumatic Neck- supple Lungs- diminished BS bases Heart- irregular  GI- soft, NT, ND Extremities- no edema Neuro- grossly intact  LABS: Basic Metabolic Panel:  Basename 01/25/12 0525 01/24/12 0538 01/23/12 0550  NA 142 142 --  K 3.5 4.0 --  CL 105 108 --  CO2 25 21 --  GLUCOSE 130* 121* --  BUN 50* 43* --  CREATININE 1.64* 1.47* --  CALCIUM 8.5 8.9 --  MG -- -- 1.9  PHOS -- -- 2.9   CBC:  Basename 01/25/12 0525 01/24/12 0538  WBC 14.6* 13.1*  NEUTROABS -- --  HGB 9.1* 9.3*  HCT 28.6* 29.3*  MCV 84.9 84.0  PLT 270 268   ASSESSMENT AND PLAN:  Principal Problem:  *ST elevation MI (STEMI) - 2 sequential bifurcation (Prox & Mid LAD) 2 overlapping BMS  Active Problems:  Hypertension  GI bleed -- history of  Chronic anemia  Acute respiratory failure  Shock  Acute renal failure  Atrial  fibrillation  1. Anterior MI SP PCI of the LAD  Continue ASA and plavix and metoprolol; add statin. EF 35-40; add ACEI later once renal function improves.  2. CAP Antibiotics per PCCM Will need to transition/ taper steroids soon; CCM managing  3. Afib Remains in atrial fibrillation. Continue amiodarone to 400mg  TID; DC digoxin Rates are better controlled. Continue ASA and plavix; I will not add coumadin at this point given anemia, h/o GI bleed and need for duel antiplt therapy. Plan DC asa in 4 weeks and continue plavix. If afib persists, add coumadin at that time and cardiovert after INR therapeutic for 3 consecutive weeks.  4. Chronic anemia SP transfusion.  Follow hemaglobin  5. Acute renal failure - appears to be prerenal; DC diuretics and follow.  Olga Millers, MD 01/25/2012 7:26 AM

## 2012-01-25 NOTE — Progress Notes (Signed)
CARDIAC REHAB PHASE I   PRE:  Rate/Rhythm: 70 Afib   BP:  Supine: 110/70  Sitting:   Standing:    SaO2: 92 RA  MODE:  Ambulation: 236 ft   POST:  Rate/Rhythem: 76  BP:  Supine:   Sitting: 116/70  Standing:    SaO2: 94 RA 1023-1050 Assisted X 1 and used walker to ambulate. Gait steady with walker. Pt c/o of not sleeping well last night and feeling tired. She was able to walk 236 feet with several standing rest stops. She c/o of feeling SOB and tiredness, denies any cp. Pt back to bed after walk.  Beatrix Fetters

## 2012-01-26 ENCOUNTER — Inpatient Hospital Stay (HOSPITAL_COMMUNITY): Payer: Medicare Other

## 2012-01-26 DIAGNOSIS — I509 Heart failure, unspecified: Secondary | ICD-10-CM

## 2012-01-26 DIAGNOSIS — I5021 Acute systolic (congestive) heart failure: Secondary | ICD-10-CM

## 2012-01-26 DIAGNOSIS — J984 Other disorders of lung: Secondary | ICD-10-CM

## 2012-01-26 LAB — BASIC METABOLIC PANEL
CO2: 24 mEq/L (ref 19–32)
Calcium: 8.6 mg/dL (ref 8.4–10.5)
Chloride: 106 mEq/L (ref 96–112)
Creatinine, Ser: 1.71 mg/dL — ABNORMAL HIGH (ref 0.50–1.10)
GFR calc Af Amer: 30 mL/min — ABNORMAL LOW (ref >90)
Sodium: 140 mEq/L (ref 135–145)

## 2012-01-26 MED ORDER — AMIODARONE HCL 200 MG PO TABS
200.0000 mg | ORAL_TABLET | Freq: Two times a day (BID) | ORAL | Status: DC
  Administered 2012-01-26 – 2012-01-27 (×3): 200 mg via ORAL
  Filled 2012-01-26 (×4): qty 1

## 2012-01-26 MED ORDER — FUROSEMIDE 10 MG/ML IJ SOLN
80.0000 mg | Freq: Once | INTRAMUSCULAR | Status: AC
  Administered 2012-01-26: 80 mg via INTRAVENOUS

## 2012-01-26 MED ORDER — FUROSEMIDE 10 MG/ML IJ SOLN
40.0000 mg | Freq: Once | INTRAMUSCULAR | Status: AC
  Administered 2012-01-26: 40 mg via INTRAVENOUS
  Filled 2012-01-26: qty 4

## 2012-01-26 MED ORDER — FUROSEMIDE 10 MG/ML IJ SOLN
INTRAMUSCULAR | Status: AC
  Administered 2012-01-26: 80 mg via INTRAVENOUS
  Filled 2012-01-26: qty 8

## 2012-01-26 MED ORDER — NITROGLYCERIN 0.4 MG SL SUBL
0.4000 mg | SUBLINGUAL_TABLET | SUBLINGUAL | Status: DC | PRN
  Administered 2012-01-26: 0.4 mg via SUBLINGUAL
  Filled 2012-01-26: qty 25

## 2012-01-26 MED ORDER — NITROGLYCERIN IN D5W 200-5 MCG/ML-% IV SOLN
2.0000 ug/min | INTRAVENOUS | Status: DC
  Administered 2012-01-26: 25 ug/min via INTRAVENOUS
  Filled 2012-01-26 (×2): qty 250

## 2012-01-26 NOTE — Progress Notes (Signed)
CARDIAC REHAB PHASE I   PRE:  Rate/Rhythm: 67 SR    BP: sitting 120/60    SaO2: 86 RA, 90 1L  MODE:  Ambulation: 150 ft   POST:  Rate/Rhythm: 71     BP: sitting 126/70     SaO2: 94 2L  Pt SaO2 86 Ra in chair. Sts she had episode of SOB, like she couldn't get something up out of her chest, last night. Sts breathing tx helped. Needed O2 to walk. Still DOE with x3 rests for shorter distance today. Pt probably could walk further but have not been pushing her. To recliner, left on O2. Notified RN. Pt has not been progressing with [place]. [id number]  Harriet Masson CES, ACSM

## 2012-01-26 NOTE — Progress Notes (Signed)
Patient ID: [name] Green, female   DOB: 1925-12-12, 78 y.o.   MRN: [id number]   Called to assess patient for dyspnea/respiratory distress.   Gradual onset today of worsening dyspnea.  Short of breath at rest tonight and hypoxic.  Placed on face mask.  CXR with pulmonary edema.  Volume overloaded on exam with elevated JVP.  ECG unchanged from prior.  EF 35-40% by echo.  She has not been on Lasix due to creatinine rise post-cath.  - NTG sublingual followed by NTG gtt, titrating up as tolerated for CHF.  - Lasix 40 mg IV => minimal response so will give 80 mg IV bolus.  Elevated creatinine will make diuresis difficult.  - Transfer to step down.  - Will get TnI and BNP.   35 min critical care time.   Marca Ancona 01/26/2012 11:11 PM

## 2012-01-26 NOTE — Progress Notes (Signed)
   SUBJECTIVE: [name] Green is an 78 yo admitted with an anterior MI - s/p stenting of her LAD using 2 overlapping bare metal stents.  Her post MI course was complicated by cardiogenic shock requiring IV dopamine.  She has improved.  She denies any chest pain.  She is ambulating with cardiac rehab with minimal difficulty.      Marland Kitchen amiodarone  400 mg Oral TID  . aspirin  81 mg Oral Daily  . atorvastatin  20 mg Oral q1800  . clopidogrel  75 mg Oral Q breakfast  . enoxaparin (LOVENOX) injection  30 mg Subcutaneous Q24H  . hydrocortisone sodium succinate  50 mg Intravenous Q6H  . levofloxacin (LEVAQUIN) IV  500 mg Intravenous Q48H  . metoprolol tartrate  50 mg Oral BID  . pantoprazole  40 mg Oral Q1200      . sodium chloride 20 mL/hr (01/21/12 0900)    OBJECTIVE: Physical Exam: Filed Vitals:   01/25/12 0443 01/25/12 1312 01/25/12 2012 01/26/12 0449  BP: 125/58 109/65 124/75 130/60  Pulse: 70 71 75 76  Temp: 97.5 F (36.4 C) 97.8 F (36.6 C) 98.1 F (36.7 C) 97.7 F (36.5 C)  TempSrc: Oral Tympanic Oral Oral  Resp: 19 18 18 18   Height:      Weight:      SpO2: 92% 94% 92% 90%    Intake/Output Summary (Last 24 hours) at 01/26/12 1914 Last data filed at 01/25/12 1630  Gross per 24 hour  Intake    480 ml  Output   1200 ml  Net   -720 ml    Telemetry : NSR  GEN- The patient is elderly appearing, alert and oriented x 3 today.   Head- normocephalic, atraumatic Neck- supple Lungs- diminished BS bases Heart- RR  GI- soft, NT, ND Extremities- no edema Neuro- grossly intact  LABS: Basic Metabolic Panel:  Basename 01/26/12 0450 01/25/12 0525  NA 140 142  K 3.9 3.5  CL 106 105  CO2 24 25  GLUCOSE 137* 130*  BUN 53* 50*  CREATININE 1.71* 1.64*  CALCIUM 8.6 8.5  MG -- --  PHOS -- --   CBC:  Basename 01/25/12 0525 01/24/12 0538  WBC 14.6* 13.1*  NEUTROABS -- --  HGB 9.1* 9.3*  HCT 28.6* 29.3*  MCV 84.9 84.0  PLT 270 268   ASSESSMENT AND PLAN:  Principal  Problem:  *ST elevation MI (STEMI) - 2 sequential bifurcation (Prox & Mid LAD) 2 overlapping BMS  Active Problems:  Hypertension  GI bleed -- history of  Chronic anemia  Acute respiratory failure  Shock  Acute renal failure  Atrial fibrillation  1. Anterior MI SP PCI of the LAD  Continue ASA and plavix and metoprolol; add statin. EF 35-40; add ACEI later once renal function improves.  2. CAP Antibiotics per PCCM Will need to transition/ taper steroids soon; CCM managing  3. Afib She converted to NSR around midnight.  Will decrease amiodarone to 200 BID.  The decision has been mad to avoid coumadin for now.  She will remain on ASA and Plavix  4. Chronic anemia SP transfusion.  Follow hemaglobin  5. Acute renal failure - appears to be prerenal; may be also due to her 2 heart caths.    Creatinine continues to slowly increase.    Elyn Aquas., MD 01/26/2012 8:32 AM

## 2012-01-27 ENCOUNTER — Inpatient Hospital Stay (HOSPITAL_COMMUNITY): Payer: Medicare Other

## 2012-01-27 LAB — BASIC METABOLIC PANEL
CO2: 25 mEq/L (ref 19–32)
Calcium: 8.8 mg/dL (ref 8.4–10.5)
GFR calc non Af Amer: 38 mL/min — ABNORMAL LOW (ref >90)
Glucose, Bld: 118 mg/dL — ABNORMAL HIGH (ref 70–99)
Potassium: 4.2 mEq/L (ref 3.5–5.1)
Sodium: 137 mEq/L (ref 135–145)

## 2012-01-27 LAB — TROPONIN I
Troponin I: 1.61 ng/mL (ref <0.30)
Troponin I: 1.02 ng/mL (ref <0.30)

## 2012-01-27 LAB — PRO B NATRIURETIC PEPTIDE: Pro B Natriuretic peptide (BNP): >70000 pg/mL — ABNORMAL HIGH (ref 0–450)

## 2012-01-27 MED ORDER — AMIODARONE HCL 200 MG PO TABS
200.0000 mg | ORAL_TABLET | Freq: Every day | ORAL | Status: DC
  Administered 2012-01-28 – 2012-02-03 (×7): 200 mg via ORAL
  Filled 2012-01-27 (×7): qty 1

## 2012-01-27 MED ORDER — PREDNISONE 20 MG PO TABS
40.0000 mg | ORAL_TABLET | Freq: Every day | ORAL | Status: DC
  Administered 2012-01-28 – 2012-01-29 (×2): 40 mg via ORAL
  Filled 2012-01-27 (×3): qty 2

## 2012-01-27 MED ORDER — FUROSEMIDE 10 MG/ML IJ SOLN
40.0000 mg | Freq: Two times a day (BID) | INTRAMUSCULAR | Status: DC
  Administered 2012-01-27 – 2012-01-28 (×3): 40 mg via INTRAVENOUS
  Filled 2012-01-27 (×4): qty 4

## 2012-01-27 NOTE — Progress Notes (Signed)
Called by bedside RN for increased work of breathing and wheezing. Per bedside RN, patient had similar episode of increased work of breathing and wheezing last night, but quickly recovered to baseline after receiving a breathing treatment. Tonight patient received breathing treatment with no improvement. Upon arrival to unit patient HR 83, BP 148/86, RR 30, 87% 3 LNC, wheezing heard in bilateral upper lobes. Chest xray obtained per protocol. Cardiology fellow at bedside, orders received for SL NTG, NTG drip, and 40mg  IV lasix.  Patient transferred to stepdown for further management. Will continue to monitor and advised bedside RN to call if needed further assistance.   Follow up: 0300 Patient resting comfortably with no shortness of breath or increased work of breathing, HR 77 BP 136/50 RR 19 95% 4L [place], NTG drip at 66mcg/min, will continue to monitor

## 2012-01-27 NOTE — Progress Notes (Signed)
Patient transferred from 2000 via bed on tele with RN at side. Patient with dyspnea at rest, increased oxygen to 5L to maintain oxygen saturations above 90%. Oriented patient to unit and room, instructed on callbell and placed at side. Patient's family at bedside and updated on plan of care. Will continue to monitor.

## 2012-01-27 NOTE — Progress Notes (Signed)
   SUBJECTIVE: Dyspnea much better, no chest pain    . amiodarone  200 mg Oral BID  . aspirin  81 mg Oral Daily  . atorvastatin  20 mg Oral q1800  . clopidogrel  75 mg Oral Q breakfast  . enoxaparin (LOVENOX) injection  30 mg Subcutaneous Q24H  . hydrocortisone sodium succinate  50 mg Intravenous Q6H  . levofloxacin (LEVAQUIN) IV  500 mg Intravenous Q48H  . metoprolol tartrate  50 mg Oral BID  . pantoprazole  40 mg Oral Q1200      . sodium chloride 10 mL/hr at 01/27/12 0003  . nitroGLYCERIN 50 mcg/min (01/27/12 0500)    OBJECTIVE: Physical Exam: Filed Vitals:   01/27/12 0300 01/27/12 0555 01/27/12 0714 01/27/12 0747  BP: 136/50  132/54   Pulse: 76  70   Temp: 97.5 F (36.4 C)  97.5 F (36.4 C) 97.5 F (36.4 C)  TempSrc: Oral  Oral Oral  Resp: 19  15   Height:      Weight:      SpO2: 95% 96% 96%     Intake/Output Summary (Last 24 hours) at 01/27/12 0935 Last data filed at 01/27/12 0800  Gross per 24 hour  Intake 671.25 ml  Output   1750 ml  Net -1078.75 ml    Telemetry reveals sinus rhythm  GEN- The patient is elderly appearing, alert and oriented x 3 today.   Head- normocephalic, atraumatic Neck- supple Lungs- diminished BS bases Heart- RRR GI- soft, NT, ND Extremities- no edema Neuro- grossly intact  LABS: Basic Metabolic Panel:  Basename 01/27/12 0542 01/26/12 0450  NA 137 140  K 4.2 3.9  CL 102 106  CO2 25 24  GLUCOSE 118* 137*  BUN 13 53*  CREATININE 1.26* 1.71*  CALCIUM 8.8 8.6  MG -- --  PHOS -- --   CBC:  Basename 01/25/12 0525  WBC 14.6*  NEUTROABS --  HGB 9.1*  HCT 28.6*  MCV 84.9  PLT 270   ASSESSMENT AND PLAN:   1. Anterior MI SP PCI of the LAD  Continue ASA, plavix, statin and metoprolol. EF 35-40; add ACEI later once renal function stable.  2. CAP Continue levaquin for 10 full days; begin steroid wean.  3. Afib Patient in sinus; change amiodarone to 200 mg po daily. Continue ASA and plavix; I have not added  coumadin given anemia, h/o GI bleed and need for duel antiplt therapy.   4. Chronic anemia SP transfusion.  Follow hemaglobin  5. Acute renal failure - renal function improving.  6. Combined acute systolic and diastolic CHF - events of last PM noted; will resume lasix at 40 mg bid today; repeat BMET in AM; may need lower dose pending fu labs. DC NTG. Note fu troponin from last PM elevated but most likely trending down from previous MI.  Olga Millers, MD 01/27/2012 9:35 AM

## 2012-01-27 NOTE — Progress Notes (Signed)
CRITICAL VALUE ALERT  Critical value received:  Troponin 1.61  Date of notification:  01/27/12  Time of notification:  0154  Critical value read back:yes  Nurse who received alert:  D. Madelin Rear, RN  MD notified (1st page):  Dr. Shirlee Latch  Time of first page:  0155  MD notified (2nd page):  Time of second page:  Responding MD:  Dr. Shirlee Latch  Time MD responded:  0200  New orders received.

## 2012-01-28 LAB — BASIC METABOLIC PANEL
BUN: 58 mg/dL — ABNORMAL HIGH (ref 6–23)
BUN: 55 mg/dL — ABNORMAL HIGH (ref 6–23)
Chloride: 102 mEq/L (ref 96–112)
Chloride: 99 mEq/L (ref 96–112)
Creatinine, Ser: 1.92 mg/dL — ABNORMAL HIGH (ref 0.50–1.10)
GFR calc Af Amer: 26 mL/min — ABNORMAL LOW (ref >90)
GFR calc Af Amer: 26 mL/min — ABNORMAL LOW (ref >90)
GFR calc non Af Amer: 23 mL/min — ABNORMAL LOW (ref >90)
GFR calc non Af Amer: 23 mL/min — ABNORMAL LOW (ref >90)
Potassium: 3.9 mEq/L (ref 3.5–5.1)
Sodium: 142 mEq/L (ref 135–145)

## 2012-01-28 LAB — CBC
MCHC: 32.3 g/dL (ref 30.0–36.0)
Platelets: 237 10*3/uL (ref 150–400)
RDW: 15.3 % (ref 11.5–15.5)
WBC: 11.8 10*3/uL — ABNORMAL HIGH (ref 4.0–10.5)

## 2012-01-28 MED ORDER — MILRINONE IN DEXTROSE 20 MG/100ML IV SOLN
0.2500 ug/kg/min | INTRAVENOUS | Status: DC
  Administered 2012-01-28 – 2012-02-01 (×5): 0.25 ug/kg/min via INTRAVENOUS
  Filled 2012-01-28 (×6): qty 100

## 2012-01-28 MED ORDER — DOCUSATE SODIUM 100 MG PO CAPS
100.0000 mg | ORAL_CAPSULE | Freq: Two times a day (BID) | ORAL | Status: DC | PRN
  Administered 2012-01-29 – 2012-01-30 (×2): 100 mg via ORAL
  Filled 2012-01-28 (×3): qty 1

## 2012-01-28 MED ORDER — POTASSIUM CHLORIDE CRYS ER 20 MEQ PO TBCR
40.0000 meq | EXTENDED_RELEASE_TABLET | Freq: Once | ORAL | Status: AC
  Administered 2012-01-28: 40 meq via ORAL
  Filled 2012-01-28: qty 2

## 2012-01-28 NOTE — Progress Notes (Signed)
Patient: [name] Green / Admit Date: 01/17/2012 / Date of Encounter: 01/28/2012, 10:22 AM   Subjective  79 y/ o F with history of colon CA, HTN, anemia, recent diverticular GIB 06/2011 presented 12/22 with STEMI s/p BMS to prox/mid LAD. Course complicated by hypotension/sepsis/PNA requiring pressors/abx, anemia, atrial fib with RVR, and acute renal failure. She also developed pulm edema/CHF 12/31 requiring initiation of diuresis.  TODAY: feels weak but otherwise no complaints.   Objective   Telemetry: NSR Physical Exam: Filed Vitals:   01/28/12 0800  BP: 117/47  Pulse: 58  Temp: 97.9 F (36.6 C)  Resp: 18   General: Well developed, well nourished, in no acute distress. Head: Normocephalic, atraumatic, sclera non-icteric, no xanthomas, nares are without discharge. Neck: Negative for carotid bruits. JVD not elevated. Lungs: markedly decreased breath sounds bilaterally, few rales Heart: RRR S1 S2 without murmurs, rubs, or gallops.  Abdomen: Soft, non-tender, non-distended with normoactive bowel sounds. No hepatomegaly. No rebound/guarding. No obvious abdominal masses. Msk:  Strength and tone appear normal for age. Extremities: No clubbing or cyanosis. No edema.  Distal pedal pulses are 2+ and equal bilaterally. Neuro: Alert and oriented X 3. Moves all extremities spontaneously. Psych:  Responds to questions appropriately with a normal affect.    Intake/Output Summary (Last 24 hours) at 01/28/12 1022 Last data filed at 01/28/12 0900  Gross per 24 hour  Intake 649.67 ml  Output   1900 ml  Net -1250.33 ml    Inpatient Medications:    . amiodarone  200 mg Oral Daily  . aspirin  81 mg Oral Daily  . atorvastatin  20 mg Oral q1800  . clopidogrel  75 mg Oral Q breakfast  . enoxaparin (LOVENOX) injection  30 mg Subcutaneous Q24H  . furosemide  40 mg Intravenous BID  . levofloxacin (LEVAQUIN) IV  500 mg Intravenous Q48H  . metoprolol tartrate  50 mg Oral BID  . pantoprazole  40 mg  Oral Q1200  . predniSONE  40 mg Oral Q breakfast    Labs:  [facility] 01/28/12 0445 01/27/12 0542  NA 145 137  K 2.8* 4.2  CL 102 102  CO2 33* 25  GLUCOSE 89 118*  BUN 58* 13  CREATININE 1.92* 1.26*  CALCIUM 8.2* 8.8  MG -- --  PHOS -- --   Basename 01/28/12 0445  WBC 11.8*  NEUTROABS --  HGB 9.7*  HCT 30.0*  MCV 86.0  PLT 237    Basename 01/27/12 0858 01/27/12 0059  CKTOTAL -- --  CKMB -- --  TROPONINI 1.02* 1.61*    Radiology/Studies:  Dg Chest 2 View 01/27/2012  *RADIOLOGY REPORT*  Clinical Data: Decreased breath sounds in the bases.  CHEST - 2 VIEW  Comparison: 01/26/2012  Findings: 0645 hours.  The The cardiopericardial silhouette is enlarged.  Bibasilar collapse / consolidation with small bilateral pleural effusions again noted.  Interstitial pulmonary edema is evident.  Bones are diffusely demineralized.  IMPRESSION: Interstitial pulmonary edema with persistent bibasilar collapse / consolidation and small bilateral pleural effusions.   Original Report Authenticated By: Kennith Center, M.D.    Dg Chest Portable 1 View 01/26/2012  *RADIOLOGY REPORT*  Clinical Data: Shortness of breath; left-sided chest pain and wheezing.  PORTABLE CHEST - 1 VIEW  Comparison: Chest radiograph performed 01/22/2012  Findings: Persistent small bilateral pleural effusions are noted, with mildly worsened bibasilar airspace opacification and underlying vascular congestion, compatible with persistent pulmonary edema.  No pneumothorax is seen.  The cardiomediastinal silhouette is enlarged.  Calcification is  noted in the aortic arch.  No acute osseous abnormalities are seen.  IMPRESSION: Persistent pulmonary edema noted, with relatively stable small bilateral pleural effusions, and mildly worsened bibasilar airspace opacification.  Underlying vascular congestion and cardiomegaly again seen.   Original Report Authenticated By: Tonia Ghent, M.D.    Dg Chest Port 1 View 01/22/2012  *RADIOLOGY REPORT*   Clinical Data: Respiratory failure  PORTABLE CHEST - 1 VIEW  Comparison: Prior chest x-ray 01/21/2012  Findings: Unchanged position of left IJ approach central venous catheter.  Catheter tip projects over the superior cavoatrial junction.  Unchanged cardiomegaly.  Slightly increased pulmonary vascular congestion and mild edema.  Slightly increased bilateral layering pleural effusions and associated bibasilar opacities. Atherosclerotic vascular calcifications again noted throughout the aorta.  Probable calcified 1.5 cm splenic artery aneurysm. No acute osseous abnormality.  IMPRESSION:  1.  Slight interval increase in pulmonary vascular congestion, interstitial edema and bilateral layering effusions suggesting increasing CHF versus volume overload. 2.  Increased bibasilar opacities likely reflective of increased pleural fluid combined with atelectasis.  Superimposed infection difficult to exclude radiographically. 3.  Probable 1.5 cm calcified splenic artery aneurysm. 4.  Stable and satisfactory left IJ central venous catheter.   Original Report Authenticated By: Malachy Moan, M.D.      Assessment and Plan  1. Anterior STEMI 01/17/12 s/p PCI/BMS to mid LAD prox LAD, relook cath 12/23 in setting of CP with patent stents -  Cont ASA, statin, Plavix, BB.   2. Acute combined systolic and diastolic CHF/ICM EF 35-40% with pulm edema/acute respiratory failure - diuresis limited by renal function. Will discontinue further doses of Lasix until eval'd by MD. Per preliminary discussion with Dr. Elease Hashimoto, add milrinone (renally dosed). 3. Acute renal failure - initial bump post cath but had improved until today -- s/p diuresis. See above. 4. CAP with possible sepsis - today is day 10 of Levaquin treatment. ?Duration per MD.  5. Shock with hypotension- cardiogenic, septic, and relative adrenal insufficiency - no longer requiring pressors. Improved. 6. Paroxysmal atrial fib with RVR this admission, not felt to be an  anticoag candidate due to recent GIB/need for DAPT - maintaining NSR on po amiodarone. Get baseline TSH/LFTs with next lab draw. 7. Chronic anemia - s/p 1 u PRBC 12/26. Hgb stable. 8. Diverticular GIB 06/2011 - Hgb stable. 9. Hypokalemia - gentle repletion today, f/u BMET at 1pm. 10. E Coli UTI - pansensitive, tx with levaquin.  Signed, Ronie Spies PA-C  Attending Note:   The patient was seen and examined.  Agree with assessment and plan as noted above.  Pt has congestive heart failure as well as acute on chronic renal insufficiency.  It is difficult to diurese her without causing worsening renal failure.   Will try Milrinone.    2. Atrial fibrillation:  Continue amiodarone 200 daily   Alvia Grove., MD, [facility] 01/28/2012, 11:56 AM

## 2012-01-28 NOTE — Progress Notes (Signed)
MEDICATION RELATED CONSULT NOTE - INITIAL   Pharmacy Consult for milrinone renal dose adjustment; Indication: CHF - acute inotropic support No Known Allergies  Patient Measurements: Height: 5' (152.4 cm) Weight: 151 lb 10.8 oz (68.8 kg) IBW/kg (Calculated) : 45.5    Vital Signs: Temp: 97.9 F (36.6 C) (01/02 0800) Temp src: Oral (01/02 0800) BP: 117/47 mmHg (01/02 0800) Pulse Rate: 58  (01/02 0800) Intake/Output from previous day: 01/01 0701 - 01/02 0700 In: 874.7 [P.O.:600; I.V.:266.7; IV Piggyback:8] Out: 2050 [Urine:2050] Intake/Output from this shift: Total I/O In: 20 [I.V.:20] Out: 150 [Urine:150]  Labs:  Basename 01/28/12 0445 01/27/12 0542 01/26/12 0450  WBC 11.8* -- --  HGB 9.7* -- --  HCT 30.0* -- --  PLT 237 -- --  APTT -- -- --  CREATININE 1.92* 1.26* 1.71*  LABCREA -- -- --  CREATININE 1.92* 1.26* 1.71*  CREAT24HRUR -- -- --  MG -- -- --  PHOS -- -- --  ALBUMIN -- -- --  PROT -- -- --  ALBUMIN -- -- --  AST -- -- --  ALT -- -- --  ALKPHOS -- -- --  BILITOT -- -- --  BILIDIR -- -- --  IBILI -- -- --   Estimated Creatinine Clearance: 18.9 ml/min (by C-G formula based on Cr of 1.92).    Medical History: Past Medical History  Diagnosis Date  . Colon cancer     Remission, 2013  (Resection 2009)      . Hypertension   . GI bleed     Likely due to diverticular bleed, June, 2013  . Chronic anemia   . ST elevation MI (STEMI)     11 AM, January 17, 2012,  treated with urgent stenting of the LAD  January 17, 2012    Medications:  Prescriptions prior to admission  Medication Sig Dispense Refill  . aspirin 81 MG chewable tablet Chew 81 mg by mouth daily.      . furosemide (LASIX) 40 MG tablet Take 40 mg by mouth daily.      . metoprolol tartrate (LOPRESSOR) 25 MG tablet Take 25 mg by mouth 2 (two) times daily.        Assessment: [name] Green is a 79 yo F with acute combined systolic and diastolic CHF to add renally adjusted milrinone for  acute inotropic support. Creat increased to 1.92 from 1.26. Creat cl ~ 20 ml/min. Wt 68.8 kg.  Plan:  1. Use milrinone EPIC orderset 2. Start milrinone with NO bolus at 0.25 mcg/kg/min 3. If SBP < 80, hold and notify MD 4. Pharmacy will NOT adjust rate - all further rate adjustments per MD 5. No taper needed to DC drip Thank you Herby Abraham, Pharm.D. [phone] 01/28/2012 11:33 AM

## 2012-01-28 NOTE — Progress Notes (Signed)
Called for K 2.8. Cr 1.92. Will have to be cautious with K replacement. Give PO now and recheck BMET at 1pm. Ronie Spies PA-C

## 2012-01-29 LAB — BASIC METABOLIC PANEL
Calcium: 8.5 mg/dL (ref 8.4–10.5)
Creatinine, Ser: 1.9 mg/dL — ABNORMAL HIGH (ref 0.50–1.10)
GFR calc Af Amer: 27 mL/min — ABNORMAL LOW (ref >90)

## 2012-01-29 LAB — HEPATIC FUNCTION PANEL
Bilirubin, Direct: 0.1 mg/dL (ref 0.0–0.3)
Indirect Bilirubin: 0.4 mg/dL (ref 0.3–0.9)

## 2012-01-29 LAB — CBC
Hemoglobin: 9 g/dL — ABNORMAL LOW (ref 12.0–15.0)
MCHC: 31.5 g/dL (ref 30.0–36.0)
Platelets: 213 10*3/uL (ref 150–400)
RBC: 3.34 MIL/uL — ABNORMAL LOW (ref 3.87–5.11)

## 2012-01-29 LAB — TSH: TSH: 2.014 u[IU]/mL (ref 0.350–4.500)

## 2012-01-29 MED ORDER — POTASSIUM CHLORIDE CRYS ER 20 MEQ PO TBCR
20.0000 meq | EXTENDED_RELEASE_TABLET | Freq: Every day | ORAL | Status: DC
  Administered 2012-01-29 – 2012-02-03 (×6): 20 meq via ORAL
  Filled 2012-01-29 (×6): qty 1

## 2012-01-29 MED ORDER — FUROSEMIDE 10 MG/ML IJ SOLN
40.0000 mg | Freq: Every day | INTRAMUSCULAR | Status: DC
  Administered 2012-01-29 – 2012-01-30 (×2): 40 mg via INTRAVENOUS
  Filled 2012-01-29: qty 4

## 2012-01-29 MED ORDER — PREDNISONE 10 MG PO TABS
10.0000 mg | ORAL_TABLET | Freq: Every day | ORAL | Status: DC
  Administered 2012-02-02 – 2012-02-03 (×2): 10 mg via ORAL
  Filled 2012-01-29 (×3): qty 1

## 2012-01-29 MED ORDER — PREDNISONE 20 MG PO TABS
20.0000 mg | ORAL_TABLET | Freq: Every day | ORAL | Status: AC
  Administered 2012-01-30 – 2012-02-01 (×3): 20 mg via ORAL
  Filled 2012-01-29 (×3): qty 1

## 2012-01-29 MED ORDER — BISACODYL 10 MG RE SUPP
10.0000 mg | Freq: Every day | RECTAL | Status: DC | PRN
  Administered 2012-01-29 – 2012-02-01 (×2): 10 mg via RECTAL
  Filled 2012-01-29 (×2): qty 1

## 2012-01-29 MED ORDER — PREDNISONE 10 MG PO TABS: 10.0000 mg | ORAL_TABLET | Freq: Every day | ORAL | Status: DC

## 2012-01-29 MED ORDER — PREDNISONE 5 MG PO TABS: 5.0000 mg | ORAL_TABLET | Freq: Every day | ORAL | Status: DC

## 2012-01-29 NOTE — Progress Notes (Addendum)
Patient: [name] Green / Admit Date: 01/17/2012 / Date of Encounter: 01/29/2012, 9:42 AM   Subjective  79 y/ o F with history of colon CA, HTN, anemia, recent diverticular GIB 06/2011 presented 12/22 with STEMI s/p BMS to prox/mid LAD. Course complicated by hypotension/sepsis/PNA requiring pressors/abx, anemia, atrial fib with RVR, and acute renal failure. She also developed pulm edema/CHF 12/31 requiring initiation of diuresis.  She was started on Milrinone yesterday.     Objective   Telemetry: NSR Physical Exam: Filed Vitals:   01/29/12 0800  BP: 130/50  Pulse: 63  Temp: 97.7 F (36.5 C)  Resp: 15   General: Well developed, well nourished, in no acute distress. Head: Normocephalic, atraumatic, sclera non-icteric, no xanthomas, nares are without discharge. Neck: Negative for carotid bruits. JVD not elevated. Lungs: markedly decreased breath sounds bilaterally,  Heart: RRR S1 S2 without murmurs, rubs, or gallops.  Abdomen: Soft, non-tender, non-distended with normoactive bowel sounds. No hepatomegaly. No rebound/guarding. No obvious abdominal masses. Msk:  Strength and tone appear normal for age. Extremities: No clubbing or cyanosis. No edema.  Distal pedal pulses are 2+ and equal bilaterally. Neuro: Alert and oriented X 3. Moves all extremities spontaneously. Psych:  Responds to questions appropriately with a normal affect.    Intake/Output Summary (Last 24 hours) at 01/29/12 0942 Last data filed at 01/29/12 0900  Gross per 24 hour  Intake 346.73 ml  Output   1125 ml  Net -778.27 ml    Inpatient Medications:     . amiodarone  200 mg Oral Daily  . aspirin  81 mg Oral Daily  . atorvastatin  20 mg Oral q1800  . clopidogrel  75 mg Oral Q breakfast  . enoxaparin (LOVENOX) injection  30 mg Subcutaneous Q24H  . levofloxacin (LEVAQUIN) IV  500 mg Intravenous Q48H  . metoprolol tartrate  50 mg Oral BID  . pantoprazole  40 mg Oral Q1200  . predniSONE  40 mg Oral Q breakfast     Labs:  [facility] 01/29/12 0505 01/28/12 1552  NA 141 142  K 3.6 3.9  CL 101 99  CO2 33* 34*  GLUCOSE 90 157*  BUN 60* 55*  CREATININE 1.90* 1.92*  CALCIUM 8.5 8.8  MG -- --  PHOS -- --    Basename 01/29/12 0505 01/28/12 0445  WBC 13.5* 11.8*  NEUTROABS -- --  HGB 9.0* 9.7*  HCT 28.6* 30.0*  MCV 85.6 86.0  PLT 213 237    Basename 01/27/12 0858 01/27/12 0059  CKTOTAL -- --  CKMB -- --  TROPONINI 1.02* 1.61*    Radiology/Studies:  Dg Chest 2 View 01/27/2012  *RADIOLOGY REPORT*  Clinical Data: Decreased breath sounds in the bases.  CHEST - 2 VIEW  Comparison: 01/26/2012  Findings: 0645 hours.  The The cardiopericardial silhouette is enlarged.  Bibasilar collapse / consolidation with small bilateral pleural effusions again noted.  Interstitial pulmonary edema is evident.  Bones are diffusely demineralized.  IMPRESSION: Interstitial pulmonary edema with persistent bibasilar collapse / consolidation and small bilateral pleural effusions.   Original Report Authenticated By: Kennith Center, M.D.    Dg Chest Portable 1 View 01/26/2012  *RADIOLOGY REPORT*  Clinical Data: Shortness of breath; left-sided chest pain and wheezing.  PORTABLE CHEST - 1 VIEW  Comparison: Chest radiograph performed 01/22/2012  Findings: Persistent small bilateral pleural effusions are noted, with mildly worsened bibasilar airspace opacification and underlying vascular congestion, compatible with persistent pulmonary edema.  No pneumothorax is seen.  The cardiomediastinal silhouette is enlarged.  Calcification is noted in the aortic arch.  No acute osseous abnormalities are seen.  IMPRESSION: Persistent pulmonary edema noted, with relatively stable small bilateral pleural effusions, and mildly worsened bibasilar airspace opacification.  Underlying vascular congestion and cardiomegaly again seen.   Original Report Authenticated By: Tonia Ghent, M.D.    Dg Chest Port 1 View 01/22/2012  *RADIOLOGY REPORT*  Clinical  Data: Respiratory failure  PORTABLE CHEST - 1 VIEW  Comparison: Prior chest x-ray 01/21/2012  Findings: Unchanged position of left IJ approach central venous catheter.  Catheter tip projects over the superior cavoatrial junction.  Unchanged cardiomegaly.  Slightly increased pulmonary vascular congestion and mild edema.  Slightly increased bilateral layering pleural effusions and associated bibasilar opacities. Atherosclerotic vascular calcifications again noted throughout the aorta.  Probable calcified 1.5 cm splenic artery aneurysm. No acute osseous abnormality.  IMPRESSION:  1.  Slight interval increase in pulmonary vascular congestion, interstitial edema and bilateral layering effusions suggesting increasing CHF versus volume overload. 2.  Increased bibasilar opacities likely reflective of increased pleural fluid combined with atelectasis.  Superimposed infection difficult to exclude radiographically. 3.  Probable 1.5 cm calcified splenic artery aneurysm. 4.  Stable and satisfactory left IJ central venous catheter.   Original Report Authenticated By: Malachy Moan, M.D.      Assessment and Plan  1. Anterior STEMI 01/17/12 s/p PCI/BMS to mid LAD prox LAD, relook cath 12/23 in setting of CP with patent stents -  Cont ASA, statin, Plavix, BB.   2. Acute combined systolic and diastolic CHF/ICM EF 35-40% with pulm edema/acute respiratory failure - diuresis limited by renal function.  She now is on Milrinone and has continued to diurese - renal fxn seems to be stable.  Will try to restart Lasix and Kdur and follow   3. Acute renal failure - initial bump post cath but had improved, stable for now-  Will need to watch closely as we are restarting Lasix ( along with Milrinone today)   4. CAP with possible sepsis - she has completed her course of Levaquin.  Will DC.  She was placed on Prednisone for presumed pnemonitis and relative adrenal insufficiency.  Will taper over the next week or so.   5. Shock with  hypotension- cardiogenic, septic, and relative adrenal insufficiency - no longer requiring pressors. Improved.  6. Paroxysmal atrial fib with RVR this admission, not felt to be an anticoag candidate due to recent GIB/need for DAPT - maintaining NSR on po amiodarone. Get baseline TSH/LFTs with next lab draw.  7. Chronic anemia - s/p 1 u PRBC 12/26. Hgb stable. 8. Diverticular GIB 06/2011 - Hgb stable. 9. Hypokalemia - better 10. E Coli UTI - pansensitive, tx with levaquin.   Vesta Mixer, Montez Hageman., MD, [facility] 01/29/2012, 9:56 AM Office - [phone] Pager [phone]

## 2012-01-29 NOTE — Progress Notes (Signed)
CARDIAC REHAB PHASE I   PRE:  Rate/Rhythm: 57 SB    BP: sitting 125/61    SaO2: 93 2L  MODE:  Ambulation: 320 ft   POST:  Rate/Rhythm: 72 SR    BP: sitting 141/59     SaO2: 95 3L  Pt feeling better. No major SOB walking. Did not need to rest. Pt took initiative to increase distance. Used RW, assist x1 and 3L O2. VSS. Return to recliner. Will continue to follow. [phone]  Harriet Masson CES, ACSM

## 2012-01-30 LAB — BASIC METABOLIC PANEL
CO2: 34 mEq/L — ABNORMAL HIGH (ref 19–32)
GFR calc non Af Amer: 26 mL/min — ABNORMAL LOW (ref >90)
Glucose, Bld: 85 mg/dL (ref 70–99)
Potassium: 3.8 mEq/L (ref 3.5–5.1)
Sodium: 142 mEq/L (ref 135–145)

## 2012-01-30 MED ORDER — FUROSEMIDE 10 MG/ML IJ SOLN
40.0000 mg | Freq: Two times a day (BID) | INTRAMUSCULAR | Status: DC
  Administered 2012-01-30 – 2012-02-01 (×4): 40 mg via INTRAVENOUS
  Filled 2012-01-30 (×6): qty 4

## 2012-01-30 NOTE — Progress Notes (Signed)
   SUBJECTIVE:  She ambulated again today and she thinks she did OK.  No acute SOB.  No pain.     PHYSICAL EXAM Filed Vitals:   01/29/12 2200 01/30/12 0007 01/30/12 0400 01/30/12 0749  BP:  111/72 126/53 128/44  Pulse: 57 62 66 65  Temp:  98 F (36.7 C) 97.6 F (36.4 C) 98 F (36.7 C)  TempSrc:  Oral Oral Oral  Resp:  13 12 11   Height:      Weight:   145 lb 8.1 oz (66 kg)   SpO2:  97% 97% 95%   General:  No distress HEENT:  PERRL Lungs:  Decreased breath sounds Heart:  RRR, no murmur Abdomen:  Positive bowel sounds, no rebound no guarding Extremities:  No edema Neuro: Nonfocal  LABS: Lab Results  Component Value Date   TROPONINI 1.02* 01/27/2012   Results for orders placed during the hospital encounter of 01/17/12 (from the past 24 hour(s))  BASIC METABOLIC PANEL     Status: Abnormal   Collection Time   01/30/12  7:30 AM      Component Value Range   Sodium 142  135 - 145 mEq/L   Potassium 3.8  3.5 - 5.1 mEq/L   Chloride 102  96 - 112 mEq/L   CO2 34 (*) 19 - 32 mEq/L   Glucose, Bld 85  70 - 99 mg/dL   BUN 56 (*) 6 - 23 mg/dL   Creatinine, Ser 1.61 (*) 0.50 - 1.10 mg/dL   Calcium 8.3 (*) 8.4 - 10.5 mg/dL   GFR calc non Af Amer 26 (*) >90 mL/min   GFR calc Af Amer 31 (*) >90 mL/min    Intake/Output Summary (Last 24 hours) at 01/30/12 1044 Last data filed at 01/30/12 0800  Gross per 24 hour  Intake  334.4 ml  Output    450 ml  Net -115.6 ml    ASSESSMENT AND PLAN:  Anterior STEMI 01/17/12 s/p PCI/BMS to mid LAD prox LAD, relook cath 12/23 in setting of CP with patent stents - Cont ASA, statin, Plavix, BB.  She is ambulating with cardiac rehab.   Acute combined systolic and diastolic CHF:  ICM EF 35-40%.   On Milrinone. Her renal function has tolerated IV Lasix.  I will continue the Milrinone and increase the Lasix.   Acute renal failure - As above.  CAP with possible sepsis - she has completed her course of Levaquin. Will DC. She was placed on Prednisone for  presumed pnemonitis and relative adrenal insufficiency. Continue taper.  Paroxysmal atrial fib with RVR this admission:  Not felt to be an anticoag candidate due to recent GIB/need for DAPT.  Maintaining NSR on po amiodarone.   TSH and liver profile checked yesterday and OK.   Chronic anemia - Hgb stable.   E Coli UTI -   She completed a coarse of antibiotics.     Fayrene Fearing [facility] 01/30/2012 10:44 AM

## 2012-01-30 NOTE — Progress Notes (Signed)
CARDIAC REHAB PHASE I   PRE:  Rate/Rhythm: 68 sinus  BP:  Sitting: 105/87     SaO2: 96% 2L  MODE:  Ambulation: 350 ft   POST:  Rate/Rhythem: 88 sinus  BP:  Sitting: 109/39 machine rck manual 110/50     SaO2: 90-92% 2L  Pt ambulated with Karlene Lineman, RN for 350 ft using rolling walker with assist x1.  Pt tolerated walk well, gait steady, no rest breaks.  Pt ambulated on 2L and was able to maintain at 92-93%.  Pt returned to recliner after walk with call bell in reach.  Pt encouraged to walk with staff over weekend.  We will f/u on Monday. Fabio Pierce, MA, ACSM RCEP [phone]  Hazle Nordmann

## 2012-01-31 LAB — BASIC METABOLIC PANEL
BUN: 56 mg/dL — ABNORMAL HIGH (ref 6–23)
Chloride: 100 mEq/L (ref 96–112)
GFR calc Af Amer: 29 mL/min — ABNORMAL LOW (ref >90)
Potassium: 4 mEq/L (ref 3.5–5.1)
Sodium: 141 mEq/L (ref 135–145)

## 2012-01-31 NOTE — Progress Notes (Signed)
   SUBJECTIVE:  She ambulated again today and she thinks that she continues to feel better. No chest pain .  No SOB.   PHYSICAL EXAM Filed Vitals:   01/31/12 0355 01/31/12 0445 01/31/12 0750 01/31/12 0752  BP: 113/40  114/52   Pulse: 59  58   Temp: 97.7 F (36.5 C)   97.9 F (36.6 C)  TempSrc: Oral   Oral  Resp: 8  12   Height:      Weight: 147 lb 4.3 oz (66.8 kg) 147 lb 4.3 oz (66.8 kg)    SpO2: 96%  94%    General:  No distress Neck:  No JVD. Lungs:  Decreased breath sounds Heart:  RRR, no murmur Abdomen:  Positive bowel sounds, no rebound no guarding Extremities:  No edema   LABS: Lab Results  Component Value Date   TROPONINI 1.02* 01/27/2012   Results for orders placed during the hospital encounter of 01/17/12 (from the past 24 hour(s))  BASIC METABOLIC PANEL     Status: Abnormal   Collection Time   01/31/12  5:41 AM      Component Value Range   Sodium 141  135 - 145 mEq/L   Potassium 4.0  3.5 - 5.1 mEq/L   Chloride 100  96 - 112 mEq/L   CO2 34 (*) 19 - 32 mEq/L   Glucose, Bld 88  70 - 99 mg/dL   BUN 56 (*) 6 - 23 mg/dL   Creatinine, Ser 1.61 (*) 0.50 - 1.10 mg/dL   Calcium 8.3 (*) 8.4 - 10.5 mg/dL   GFR calc non Af Amer 25 (*) >90 mL/min   GFR calc Af Amer 29 (*) >90 mL/min    Intake/Output Summary (Last 24 hours) at 01/31/12 0949 Last data filed at 01/31/12 0800  Gross per 24 hour  Intake  579.6 ml  Output   2050 ml  Net -1470.4 ml    ASSESSMENT AND PLAN:  Anterior STEMI 01/17/12 s/p PCI/BMS to mid LAD prox LAD, relook cath 12/23 in setting of CP with patent stents - Cont ASA, statin, Plavix, BB.  She is ambulating with cardiac rehab.   Acute combined systolic and diastolic CHF:  ICM EF 35-40%.   On Milrinone. Her renal function has tolerated IV Lasix.  I increased the dose yesterday.  I will continue this therapy again today. Transfer to telemetry.    Acute renal failure -  Creat stable at 1.8.    CAP with possible sepsis - She has completed her  course of Levaquin and is on a steroid taper  Paroxysmal atrial fib with RVR this admission:  Not felt to be an anticoag candidate due to recent GIB/need for DAPT.  Maintaining NSR on po amiodarone.   TSH and liver profile checked two days ago were OK. Continue current therapy.    Chronic anemia -  I will repeat a CBC in the AM.      Rollene Rotunda 01/31/2012 9:49 AM

## 2012-02-01 LAB — CBC
HCT: 30.6 % — ABNORMAL LOW (ref 36.0–46.0)
Hemoglobin: 9.5 g/dL — ABNORMAL LOW (ref 12.0–15.0)
MCV: 86 fL (ref 78.0–100.0)
RBC: 3.56 MIL/uL — ABNORMAL LOW (ref 3.87–5.11)
WBC: 14.4 10*3/uL — ABNORMAL HIGH (ref 4.0–10.5)

## 2012-02-01 LAB — BASIC METABOLIC PANEL
CO2: 34 mEq/L — ABNORMAL HIGH (ref 19–32)
Chloride: 99 mEq/L (ref 96–112)
Creatinine, Ser: 1.75 mg/dL — ABNORMAL HIGH (ref 0.50–1.10)
GFR calc Af Amer: 30 mL/min — ABNORMAL LOW (ref >90)
Potassium: 4.1 mEq/L (ref 3.5–5.1)
Sodium: 140 mEq/L (ref 135–145)

## 2012-02-01 MED ORDER — FUROSEMIDE 40 MG PO TABS
40.0000 mg | ORAL_TABLET | Freq: Two times a day (BID) | ORAL | Status: DC
  Administered 2012-02-01 – 2012-02-03 (×4): 40 mg via ORAL
  Filled 2012-02-01 (×6): qty 1

## 2012-02-01 NOTE — Progress Notes (Signed)
CARDIAC REHAB PHASE I   PRE:  Rate/Rhythm: 78 SR  BP:  Supine:   Sitting: 105/58  Standing:    SaO2: 92 RA  MODE:  Ambulation: 340 ft   POST:  Rate/Rhythem: 69  BP:  Supine:   Sitting: 99/64  Standing:    SaO2: 86 RA during walk 93 2L after walk 0915-0950 On arrival pt in chair had just finished bath. Pt had O2 off RA sat 92%. Assisted X 1 and used walker to ambulate. Gait steady with walker. Checked RA sat in hall after approx 100 feet sat 86%. O2 turned on 2L sat in hall improved to 92% with O2 on. Pt without c/o of SOB with low saturation. Pt able to walk 340 feet, no c/o of pain or SOB with walking. Pt back to recliner after walk with call light in reach.  Beatrix Fetters

## 2012-02-01 NOTE — Progress Notes (Signed)
Patient evaluated for long-term disease management services with [facility] Care Management Program. However patient does not have a [facility] Primary Care Provider. She sees Dr Anne Hahn with [facility] in [place] per patient.  Raiford Noble, MSN-Ed, RN,BSN [facility] Liaison [phone]

## 2012-02-01 NOTE — Progress Notes (Signed)
Pt impacted. Pt said they where constipated. Gave MOM and pt late ask for more gave supp, found pt to be impacted. Md page to disimpacted  Order giving

## 2012-02-01 NOTE — Progress Notes (Signed)
    Subjective:  Feels weak with ambulation. No dyspnea, chest pain, orthopnea, or PND.  Objective:  Vital Signs in the last 24 hours: Temp:  [97.2 F (36.2 C)-98.3 F (36.8 C)] 98.1 F (36.7 C) (01/06 0444) Pulse Rate:  [54-65] 65  (01/06 0900) Resp:  [12-18] 18  (01/06 1019) BP: (100-135)/(28-66) 100/56 mmHg (01/06 0900) SpO2:  [92 %-97 %] 94 % (01/06 1019) Weight:  [65 kg (143 lb 4.8 oz)-65.454 kg (144 lb 4.8 oz)] 65 kg (143 lb 4.8 oz) (01/06 0444)  Intake/Output from previous day: 01/05 0701 - 01/06 0700 In: 648.8 [P.O.:360; I.V.:288.8] Out: 525 [Urine:525]  Physical Exam: Pt is alert and oriented, elderly woman in NAD HEENT: normal Neck: JVP - normal Lungs: CTA bilaterally CV: irregular without murmur or gallop Abd: soft, NT, Positive BS, no hepatomegaly Ext: no C/C/E, distal pulses intact and equal Skin: warm/dry no rash  Lab Results:  [facility] 02/01/12 0605  WBC 14.4*  HGB 9.5*  PLT 193    Basename 02/01/12 0605 01/31/12 0541  NA 140 141  K 4.1 4.0  CL 99 100  CO2 34* 34*  GLUCOSE 88 88  BUN 55* 56*  CREATININE 1.75* 1.80*   No results found for this basename: TROPONINI:2,CK,MB:2 in the last 72 hours  Assessment/Plan:  1. Anterior MI complicated by CHF and post-MI shock 2. Acute combined systolic and diastolic heart failure 3. Acute kidney failure - creatinine stable  Overall patient appears stable. Renal function tolerating diuresis. Plan discontinue milrinone today, convert furosemide to oral, repeat BMET in am.   Tonny Bollman, M.D. 02/01/2012, 12:27 PM

## 2012-02-01 NOTE — Progress Notes (Signed)
SATURATION QUALIFICATIONS: (This note is used to comply with regulatory documentation for home oxygen)  Patient Saturations on Room Air at Rest = 92%  Patient Saturations on Room Air while Ambulating = 86%  Patient Saturations on 2 Liters of oxygen while Ambulating = 92%  Please briefly explain why patient needs home oxygen: Pt RA sat after approx 100 feet 86%. Pt states that O2 helps especially at night.

## 2012-02-02 LAB — BASIC METABOLIC PANEL
BUN: 51 mg/dL — ABNORMAL HIGH (ref 6–23)
CO2: 33 mEq/L — ABNORMAL HIGH (ref 19–32)
Chloride: 99 mEq/L (ref 96–112)
Creatinine, Ser: 1.78 mg/dL — ABNORMAL HIGH (ref 0.50–1.10)
Glucose, Bld: 130 mg/dL — ABNORMAL HIGH (ref 70–99)
Potassium: 4.5 mEq/L (ref 3.5–5.1)

## 2012-02-02 NOTE — Progress Notes (Signed)
    Subjective:  Feels a little stronger. No chest pain. Mild dyspnea with activity.   Objective:  Vital Signs in the last 24 hours: Temp:  [97.6 F (36.4 C)-97.9 F (36.6 C)] 97.6 F (36.4 C) (01/07 1449) Pulse Rate:  [51-60] 51  (01/07 1449) Resp:  [17-18] 18  (01/07 1449) BP: (112-125)/(41-48) 112/41 mmHg (01/07 1449) SpO2:  [96 %-100 %] 96 % (01/07 1449) Weight:  [64.093 kg (141 lb 4.8 oz)] 64.093 kg (141 lb 4.8 oz) (01/07 0612)  Intake/Output from previous day: 01/06 0701 - 01/07 0700 In: 1123.8 [P.O.:960; I.V.:163.8] Out: 1800 [Urine:1800]  Physical Exam: Pt is alert and oriented, elderly woman in NAD HEENT: normal Neck: JVP - normal Lungs: CTA bilaterally CV: irregular without murmur or gallop Abd: soft, NT, Positive BS, no hepatomegaly Ext: no C/C/E, distal pulses intact and equal Skin: warm/dry no rash   Lab Results:  [facility] 02/01/12 0605  WBC 14.4*  HGB 9.5*  PLT 193    Basename 02/02/12 0907 02/01/12 0605  NA 140 140  K 4.5 4.1  CL 99 99  CO2 33* 34*  GLUCOSE 130* 88  BUN 51* 55*  CREATININE 1.78* 1.75*   No results found for this basename: TROPONINI:2,CK,MB:2 in the last 72 hours  Cardiac Studies: none  Assessment/Plan:  1. Anterior MI complicated by CHF and post-MI shock  2. Acute combined systolic and diastolic heart failure  3. Acute kidney failure - creatinine stable  Pt making slow progress. She is tolerating withdrawal of IV milrinone and IV furosemide. Recommend continue current medical program which includes DAPT with ASA and plavix, amiodarone, lipitor, metoprolol, and furosemide. She lives with her son, but he works. She has lots of family and friend support and is not interested in ST-SNF. Will arrange home health services. Anticipate hospital discharge tomorrow.  Tonny Bollman, M.D. 02/02/2012, 4:16 PM

## 2012-02-02 NOTE — Progress Notes (Addendum)
CARDIAC REHAB PHASE I   PRE:  Rate/Rhythm: 61 SR  BP:  Supine:   Sitting: 120/54  Standing:    SaO2: 93 RA  MODE:  Ambulation: 460 ft   POST:  Rate/Rhythem: 67  BP:  Supine:   Sitting: 111/79  Standing:    SaO2: 93-94 RA during walk  93 RA after walk 0945-1115 Assisted X 1 and used walker to ambulate. Gait steady with walker. RA sat today during walk 93-94%. Pt without c/o during walk. I was able to get her 460 feet today. RA sat after walk 93%. Pt states that she did not wear O2 last night and denies any SOB.Pt back to recliner after walk with call light in reach. Completed MI, stent and CHF education with pt. She voices understanding. Pt agrees to [employer]. CRP in [place], will send referral.   Beatrix Fetters

## 2012-02-02 NOTE — Progress Notes (Signed)
02/02/11 1630 In to speak with pt. about [facility] services and DME.   Pt. states she lives with her son in [place] and has church members that will come and stay with her if needed.  She is interested in having a HH RN for a couple of visits.  Gave pt. list of HH agenices and she stated she wanted to talk it over with her son.  NCM will return tomorrow to inquire of [facility] agency choice.  Anticipate pt. dc tomorrow. Tera Mater, RN, BSN NCM [phone]

## 2012-02-03 ENCOUNTER — Other Ambulatory Visit: Payer: Self-pay

## 2012-02-03 ENCOUNTER — Telehealth: Payer: Self-pay

## 2012-02-03 ENCOUNTER — Encounter (HOSPITAL_COMMUNITY): Payer: Self-pay | Admitting: Cardiology

## 2012-02-03 DIAGNOSIS — I2109 ST elevation (STEMI) myocardial infarction involving other coronary artery of anterior wall: Secondary | ICD-10-CM

## 2012-02-03 DIAGNOSIS — I509 Heart failure, unspecified: Secondary | ICD-10-CM

## 2012-02-03 LAB — BASIC METABOLIC PANEL
CO2: 31 mEq/L (ref 19–32)
Calcium: 8.7 mg/dL (ref 8.4–10.5)
Chloride: 98 mEq/L (ref 96–112)
Creatinine, Ser: 1.85 mg/dL — ABNORMAL HIGH (ref 0.50–1.10)
Glucose, Bld: 78 mg/dL (ref 70–99)

## 2012-02-03 MED ORDER — POTASSIUM CHLORIDE CRYS ER 20 MEQ PO TBCR: 20.0000 meq | EXTENDED_RELEASE_TABLET | Freq: Every day | ORAL | Status: DC

## 2012-02-03 MED ORDER — AMIODARONE HCL 200 MG PO TABS: 200.0000 mg | ORAL_TABLET | Freq: Every day | ORAL | Status: DC

## 2012-02-03 MED ORDER — ATORVASTATIN CALCIUM 20 MG PO TABS: 20.0000 mg | ORAL_TABLET | Freq: Every day | ORAL | Status: AC

## 2012-02-03 MED ORDER — CLOPIDOGREL BISULFATE 75 MG PO TABS: 75.0000 mg | ORAL_TABLET | Freq: Every day | ORAL | Status: DC

## 2012-02-03 MED ORDER — METOPROLOL TARTRATE 50 MG PO TABS: 50.0000 mg | ORAL_TABLET | Freq: Two times a day (BID) | ORAL | Status: DC

## 2012-02-03 MED ORDER — FUROSEMIDE 40 MG PO TABS: 40.0000 mg | ORAL_TABLET | Freq: Every day | ORAL | Status: AC

## 2012-02-03 MED ORDER — NITROGLYCERIN 0.4 MG SL SUBL: 0.4000 mg | SUBLINGUAL_TABLET | SUBLINGUAL | Status: AC | PRN

## 2012-02-03 MED ORDER — PREDNISONE 5 MG PO TABS: ORAL_TABLET | ORAL | Status: DC

## 2012-02-03 NOTE — Progress Notes (Signed)
02/03/11 1030 In to obtain [facility] choice, and pt. chose [facility].  TC to [place], [facility], to give referral for [facility] RN, HHPT, HH aide.  Pt. states she has a walker at home.  Pt. will dc home today with family. Tera Mater, RN, BSN NCM [phone]

## 2012-02-03 NOTE — Discharge Summary (Signed)
Discharge Summary   Patient ID: [name] Green MRN: [id number], DOB/AGE: [birth year] 79 y.o.  Primary MD: Dr [facility] in [place]  Primary Cardiologist: Lorine Bears MD Admit date: 01/17/2012 D/C date:     02/03/2012      Primary Discharge Diagnoses:  1. Acute Anterior STEMI complicated by post-MI shock and CHF  - Cath showed severe 2 site bifurcation LAD stenoses involving D1 and D2, otherwise minimal luminal irregularities, significantly elevated LVEDP, and severely reduced LVEF  - Treated w/ overlapping BMS to mid and prox LAD (BMS were used due to h/o GI bleed)  - Dc'd on ASA/Plavix  2. Acute combined systolic and diastolic heart failure, EF 35-40%  - In the setting of #1  - Required Milrinone therapy and IV diuresis  - Dc'd on Lasix 40mg  daily  - No ACEI due to renal failure and hypotension, will need to address at follow up  3. Community Acquired Pneumonia   - Completed 10 day course Levaquin  4. E.Coli UTI  - Completed 10 day course Levaquin   5. Cardiogenic and Septic Shock  - In the setting of #1-4  6. Acute Kidney Failure  - In the setting of shock and diuresis  - Crt 1.85 at DC  - F/u BMET  7. Atrial Fibrillation w/ RVR  - Converted to NSR with Amiodarone and metoprolol  - not felt to be an anticoag candidate due to recent GIB/need for DAPT   8. Acute on Chronic Anemia  - s/p 1 u PRBC   - Hgb 9.5 at DC  9. Adrenal Insufficiency  - Steroid taper  10. Hyperlipidemia  - Initiated on statin, will need f/u LFTs/Lipid panel in 6 wks  11. Deconditioning  - Dc'd with Home Health  Secondary Discharge Diagnoses:  . Colon cancer     Remission, 2013  (Resection 2009)      . Hypertension   . GI bleed     Likely due to diverticular bleed, June, 2013     Allergies No Known Allergies  Diagnostic Studies/Procedures:   01/17/12 - Cardiac Cath Hemodynamics:  Central Aortic / Mean Pressures:  Initial -- 116/66 mmHg; 87 mmHg on  48mcg/min Dopamine; HR 115-120 bpm  Final (Post PCI)-- 121/61 mmHg; 23-28 mmHg; HR 100-105  Left Ventricular Pressures / EDP:  Initial -- 116/25mmHg; 25 mmHg  Final (Post PCI) -- 114/27 mmHg; 30 mmHg --> IV Lasix 20mg  given. Coronary Anatomy:  Left Main: Calcified ~20% proximal stenosis; bifurcates into LAD & Circumflex with very small Ramus Intermedius. LAD: Large caliber vessel with an early SP1/Diag 1 (D1) branch point that has a ~90% focal lesion at the branch point, followed by a ~60% stenosis (Lesion #2 - Proximal LAD) just after D1; there is a brief intervening segment that is somewhat irregular followed by a second branch point with a smaller Diag 2 but major SP1 (branching trunk) that appears to be ulcerated ~80-90% stenosed in a (1,1,0) bifurcation lesion (Lesion #1 - mid LAD) extending several mm beyond Diag2 (D2). The remainder of the vessel tapers to a moderate caliber vessel and wraps the apex perfusing the distal ~1/3 of the inferoapical wall.  D1: Small caliber, yet important branch with minimal ostial involvement in the bifurcation lesion; TIMI 3 flow. Otherwise angiographically normal.  D2: Very small caliber vessel with ~20% ostial stenosis as part of the bifurcation lesion; TIMI3 flow and otherwise angiographically normal Left Circumflex: Large caliber, non-dominant vessel with 1 small OM branch in the  proximal segment; the vessel bifurcates in the AV Groove into a Large Caliber Lateral OM1 and the terminal AV Groove branch that trifurcates into 3 small posterolateral branches. Minimal luminal irregularities.  OM1: Large Lateral branch that gives off a small "OM2" before terminating with 3 small branches almost reaching the apex. Minimal luminal irregularities. Ramus intermedius: Very small caliber vessel; Minimal luminal irregularities.  RCA: Large caliber dominant vessel that essentially terminates a moderate to large caliber Posterior Descending Artery (reaching ~2/3 of the way to  the apex) giving rise to a small Right Posterior AV Groove Branch with several small posterolateral branches and the AV Nodal Artery. At least 3 small caliber RV Marginal branches from the mid vessel. Minimal luminal irregularities. Left Ventriculography: Post PCI.  EF: ~25-30 %  Wall Motion: Severe hypokinesis of the entire Anterior - Anterolateral & inferoapical walls perfused by LAD POST-OPERATIVE DIAGNOSIS:  Severe 2 site bifurcation LAD stenoses involving D1 and D2. A likely culprit for anterior elevations was the mid LAD lesion given the ulceration.  Otherwise minimal luminal irregularities throughout the remaining coronary arteries.  Successful to site of PCI of the mid and proximal LAD as described. Bare-metal stents used due to history of GI bleeding, per request.  Severely reduced ventricular ejection fraction with expected LAD distribution wall motion abnormality.  Significant elevated LVEDP post-PCI -- she did have diuresis following 20 mg IV Lasix given prior to LV gram. PLAN OF CARE:  Transfer to CCU for ongoing care. I discussed the case with Dr. Myrtis Ser from the [facility] who will assume care.  As the patient was unable to take Plavix, we'll continue Integrilin infusion 4-6 hours beyond the time that she is able to successfully take Plavix 600 mg load.  We'll delay sheath removal by 2 hours due to ACT of greater than 500 and Cath Lab.  Restart home dose beta blocker this evening.  Would reevaluate her respiratory status, as she may require additional doses of IV Lasix due to elevated EDP.  PPI ordered.  2-D echocardiogram with contrast ordered for A.M.  01/18/12 - Cardiac Cath Hemodynamics:  AO 128/59 (88)  LV not done  Coronary angiography:  Coronary dominance: right  Left mainstem: Short without significant obstruction.  Left anterior descending (LAD): The LAD was stented yesterday with overlapping BMS. The stents remain widely patent to the apex. There is ostial  pinching of two small diagonals (70-80%) that both exit from the stent. Both have TIMI 3 flow. The distal LAD wraps the apex  Left circumflex (LCx): provides an insignificant ramus, small OM1 with 50% proximal irregularity. The large OM and av circ are free of disease.  Right coronary artery (RCA): Large dominant vessel. There is mild non obstructive ostial narrowing. There is a non obstructive 40% narrowing in the proximal portion of the mid vessel. The PDA is widely patent.  Left ventriculography: Not done  Final Conclusions:  1. Continued patency of the infarct related artery.  Recommendations:  1. Continue to observe in the CCU until clearer as to stability.   01/18/12 - Echo Study Conclusions: - Left ventricle: The cavity size was normal. Wall thickness was increased in a pattern of mild LVH. There was mild focal basal hypertrophy of the septum. Systolic function was moderately reduced. The estimated ejection fraction was in the range of 35% to 40%. There is severe hypokinesis of the mid-distalanteroseptal myocardium. Left ventricular diastolic function parameters were normal.  - Mitral valve: Calcified annulus. Mild regurgitation. - Left atrium: The  atrium was mildly dilated. - Pulmonary arteries: PA peak pressure: 37mm Hg (S).    History of Present Illness: 79 y.o. female w/ the above medical problems who transferred from [facility] to [facility] on 01/17/12 with acute anterior STEMI. She initially presented to [facility] with elevated cardiac enzymes, but no EKG changes and echo showing good LV function. Her symptoms progressed with increased chest discomfort and EKG changes consistent with anterior STEMI for which she was transferred to [facility] 2 cath lab for emergent cardiac catheterization.  Hospital Course: Cardiac cath showed severe 2 site bifurcation LAD stenoses involving D1 and D2, otherwise minimal luminal irregularities, significantly elevated LVEDP, and severely  reduced LVEF. She was treated with overlapping BMS to mid and prox LAD (BMS were used due to h/o GI bleed). She tolerated the procedure well without complications. Recommendations were made for diuresis and DAPT with ASA and Plavix for four weeks then ASA alone.  Echo showed EF 35-40% and severe hypokinesis of the mid-distalanteroseptal myocardium.  Post cath she had recurrent left arm/shoulder pain for which she was taken back to the cath lab where she was found to have continued patency of the LAD stents and otherwise nonobstructive disease. CXR was concerning for LLL pneumonia and UCx positive for E.Coli UTI for which she was placed on antibiotics. She required IVF and vasopressors for hypotension in the setting of cardiogenic and septic shock. She was felt to have mild adrenal insufficiency for which she was placed on steroids. She developed rapid atrial fibrillation and was placed on amiodarone and metoprolol with conversion to NSR. She was not felt to be a good candidate for coumadin due to h/o GI bleed and need for DAPT. Her Hgb dropped to 7.9 for which she received 1 unit PRBCs. Her renal function worsened requiring discontinuation of diuresis, which was complicated by volume overload and pulmonary edema. She was placed on NTG drip and gently diuresed and subsequently placed on Milrinone with significant improvement. Renal function improved and milrinone was titrated off and IV lasix transitioned to oral dosing. She completed a 10 day course of Levaquin. She was able to ambulate with minimal dyspnea and without chest pain. Cath sites remained stable. She preferred home health care over SNF so this was arranged by case management.   She was seen and evaluated by Dr. Kirke Corin who felt she was stable for discharge home with plans for follow up as scheduled below.  Discharge Vitals: Blood pressure 126/44, pulse 54, temperature 98 F (36.7 C), temperature source Oral, resp. rate 16, height 5\' 5"  (1.651 m),  weight 140 lb 12.8 oz (63.866 kg), SpO2 99.00%.  Labs: Component Value Date   WBC 14.4* 02/01/2012   HGB 9.5* 02/01/2012   HCT 30.6* 02/01/2012   MCV 86.0 02/01/2012   PLT 193 02/01/2012    Lab 02/03/12 0505 01/29/12 0505  NA 138 --  K 4.3 --  CL 98 --  CO2 31 --  BUN 48* --  CREATININE 1.85* --  CALCIUM 8.7 --  PROT -- 5.3*  BILITOT -- 0.5  ALKPHOS -- 38*  ALT -- 14  AST -- 15  GLUCOSE 78 --     01/27/2012 00:59  Pro B Natriuretic peptide (BNP) >70000.0 (H)     01/17/2012 15:15 01/17/2012 20:13 01/18/2012 02:05 01/27/2012 00:59 01/27/2012 08:58  Troponin I >20.00 (HH) >20.00 (HH) >20.00 (HH) 1.61 (HH) 1.02 (HH)    Discharge Medications     Medication List     As of  02/03/2012  9:53 AM    TAKE these medications         amiodarone 200 MG tablet   Commonly known as: PACERONE   Take 1 tablet (200 mg total) by mouth daily.      aspirin 81 MG chewable tablet   Chew 81 mg by mouth daily.      atorvastatin 20 MG tablet   Commonly known as: LIPITOR   Take 1 tablet (20 mg total) by mouth at bedtime.      clopidogrel 75 MG tablet   Commonly known as: PLAVIX   Take 1 tablet (75 mg total) by mouth daily with breakfast.      furosemide 40 MG tablet   Commonly known as: LASIX   Take 40 mg by mouth daily.      metoprolol 50 MG tablet   Commonly known as: LOPRESSOR   Take 1 tablet (50 mg total) by mouth 2 (two) times daily.      nitroGLYCERIN 0.4 MG SL tablet   Commonly known as: NITROSTAT   Place 1 tablet (0.4 mg total) under the tongue every 5 (five) minutes as needed for chest pain (up to 3 doses).      potassium chloride SA 20 MEQ tablet   Commonly known as: K-DUR,KLOR-CON   Take 1 tablet (20 mEq total) by mouth daily.      predniSONE 5 MG tablet   Commonly known as: DELTASONE   Take 10mg  (2 tabs) once daily on 1/9 then take 5mg  (1 tab) daily on 1/10, 1/11, and 1/12, then stop.          Disposition   Discharge Orders    Future Appointments: Provider: Department:  Dept Phone: Center:   02/10/2012 10:15 AM [facility] Nurse Selena Batten at [place] [phone] [facility]   02/10/2012 10:30 AM Ok Anis, NP Selena Batten at [facility] [phone] [facility]     Future Orders Please Complete By Expires   Amb Referral to Cardiac Rehabilitation      Comments:   Pt agrees to Outpt. CRP in [place], will send referral.   Diet - low sodium heart healthy      Increase activity slowly      Discharge instructions      Comments:   * Please take all medications as prescribed and bring them with you to your office visit  * KEEP GROIN SITES CLEAN AND DRY. Call the office for any signs of bleedings, pus, swelling, increased pain, or any other concerns. * NO HEAVY LIFTING (>10lbs) X 2 WEEKS. * NO SEXUAL ACTIVITY X 2 WEEKS.  * You were started on a cholesterol medication (Liptor) this hospitalization and will need follow up blood work (lipid panel and liver function tests) in 6-8wks.   * Patients on amiodarone may require additional periodic monitoring of other organ systems, such as lungs, thyroid, eyes, and liver function. Please talk to your doctor at your follow-up appointments about what monitoring may be necessary for you.     Follow-up Information    Follow up with Nicolasa Ducking, NP. On 02/10/2012. (10:30)    Contact information:   [facility] [address] [place] [address] [phone]           Outstanding Labs/Studies:  1. BMET in one week 2. F/u lipid panel and LFTs in 6-8 weeks 3. F/u LFTs/TFTs/PFTs etc for amiodarone monitoring   Duration of Discharge Encounter: Greater than 30 minutes including physician and PA time.  Signed, Marcanthony Sleight  PA-C 02/03/2012, 9:53 AM

## 2012-02-03 NOTE — Telephone Encounter (Signed)
TCM call

## 2012-02-03 NOTE — Telephone Encounter (Signed)
Do you know diagnosis for BMET?

## 2012-02-03 NOTE — Progress Notes (Signed)
    Subjective:  Feels a little stronger. No chest pain. Mild dyspnea with activity. Was up all night going to bathroom.   Objective:  Vital Signs in the last 24 hours: Temp:  [97.6 F (36.4 C)-98.1 F (36.7 C)] 98 F (36.7 C) (01/08 0517) Pulse Rate:  [51-60] 54  (01/08 0517) Resp:  [16-18] 16  (01/08 0517) BP: (112-126)/(41-49) 126/44 mmHg (01/08 0517) SpO2:  [96 %-99 %] 99 % (01/08 0517) Weight:  [140 lb 12.8 oz (63.866 kg)] 140 lb 12.8 oz (63.866 kg) (01/08 0517)  Intake/Output from previous day: 01/07 0701 - 01/08 0700 In: 840 [P.O.:840] Out: 1800 [Urine:1800]  Physical Exam: Pt is alert and oriented, elderly woman in NAD HEENT: normal Neck: JVP - normal Lungs: CTA bilaterally CV: regular without murmur or gallop Abd: soft, NT, Positive BS, no hepatomegaly Ext: no C/C/E, distal pulses intact and equal Skin: warm/dry no rash   Lab Results:  Basename 02/01/12 0605  WBC 14.4*  HGB 9.5*  PLT 193    Basename 02/03/12 0505 02/02/12 0907  NA 138 140  K 4.3 4.5  CL 98 99  CO2 31 33*  GLUCOSE 78 130*  BUN 48* 51*  CREATININE 1.85* 1.78*   No results found for this basename: TROPONINI:2,CK,MB:2 in the last 72 hours  Cardiac Studies: none  Assessment/Plan:  1. Anterior MI complicated by CHF and post-MI shock  2. Acute combined systolic and diastolic heart failure  3. Acute kidney failure - creatinine stable  Pt seems stable to be discharged home with home health services. She is off IV milrinone and IV furosemide. Recommend continue current medical program which includes DAPT with ASA and plavix, amiodarone, lipitor, metoprolol, and furosemide. Decrease Furosemide to 40 mg once daily.  She has lots of family and friend support and is not interested in ST-SNF. Discharge home. Follow up in [place] office in 1 week with BMP.   Lorine Bears, M.D. 02/03/2012, 7:50 AM

## 2012-02-03 NOTE — Progress Notes (Signed)
CARDIAC REHAB PHASE I   PRE:  Rate/Rhythm: 46 SB  BP:  Supine:   Sitting: 113/57  Standing:    SaO2: 96 RA  MODE:  Ambulation: 700 ft   POST:  Rate/Rhythem: 53  BP:  Supine:   Sitting: 114/70  Standing:    SaO2: 97 RA 1110- 1145 Assisted X 1 and used walker to ambulate. Gait steady with walker. HR 46 SB before walk, after HR 53. Pt tolerated walk well without c/o.Pt back to bed after walk with call light in reach.  [name] Green

## 2012-02-03 NOTE — Telephone Encounter (Signed)
Message copied by [facility], Tyjai Charbonnet E on Wed Feb 03, 2012 10:01 AM ------      Message from: Thersa Salt      Created: Wed Feb 03, 2012  9:38 AM      Regarding: tcm pt       appt with berge on 1/15. Pt also needs BMET can you place order and ill attach it. Per Shanda Bumps PA at [facility]

## 2012-02-10 ENCOUNTER — Ambulatory Visit (INDEPENDENT_AMBULATORY_CARE_PROVIDER_SITE_OTHER): Payer: Medicare Other | Admitting: Nurse Practitioner

## 2012-02-10 ENCOUNTER — Ambulatory Visit (INDEPENDENT_AMBULATORY_CARE_PROVIDER_SITE_OTHER): Payer: Medicare Other

## 2012-02-10 ENCOUNTER — Encounter: Payer: Self-pay | Admitting: Nurse Practitioner

## 2012-02-10 VITALS — BP 120/70 | HR 79 | Ht 60.0 in | Wt 139.5 lb

## 2012-02-10 DIAGNOSIS — I4891 Unspecified atrial fibrillation: Secondary | ICD-10-CM

## 2012-02-10 DIAGNOSIS — N184 Chronic kidney disease, stage 4 (severe): Secondary | ICD-10-CM | POA: Insufficient documentation

## 2012-02-10 DIAGNOSIS — E785 Hyperlipidemia, unspecified: Secondary | ICD-10-CM

## 2012-02-10 DIAGNOSIS — I2510 Atherosclerotic heart disease of native coronary artery without angina pectoris: Secondary | ICD-10-CM | POA: Insufficient documentation

## 2012-02-10 DIAGNOSIS — I509 Heart failure, unspecified: Secondary | ICD-10-CM

## 2012-02-10 DIAGNOSIS — I255 Ischemic cardiomyopathy: Secondary | ICD-10-CM

## 2012-02-10 DIAGNOSIS — I2589 Other forms of chronic ischemic heart disease: Secondary | ICD-10-CM

## 2012-02-10 DIAGNOSIS — R0602 Shortness of breath: Secondary | ICD-10-CM

## 2012-02-10 DIAGNOSIS — I5022 Chronic systolic (congestive) heart failure: Secondary | ICD-10-CM | POA: Insufficient documentation

## 2012-02-10 DIAGNOSIS — I480 Paroxysmal atrial fibrillation: Secondary | ICD-10-CM | POA: Insufficient documentation

## 2012-02-10 MED ORDER — METOPROLOL SUCCINATE ER 50 MG PO TB24: 50.0000 mg | ORAL_TABLET | Freq: Every day | ORAL | Status: AC

## 2012-02-10 NOTE — Progress Notes (Signed)
Patient Name: [name] Green Date of Encounter: 02/10/2012  Primary Care Provider:  Ambrose Mantle, MD Primary Cardiologist:  Judie Petit. Kirke Corin, MD  Patient Profile  79 year old female with recent hospitalization secondary to anterior ST elevation MI complicated by hypotension, sepsis, and heart failure who presents for followup.  Problem List   Past Medical History  Diagnosis Date  . Hypertension   . GI bleed     a. Likely due to diverticular bleed, June, 2013  . Chronic anemia   . CAD (coronary artery disease)     a. 01/17/12 STEMI/Cath/PCI: severe 2 site bifurcation LAD stenoses, otherwise nonobstructive CAD s/p overlapping BMS to mid/prox LAD.;  b. 12/2011 Relook Cath: patent stents, otw nonobs dzs.  . Ischemic cardiomyopathy     a. 12/2011 Echo:  EF 35-40%  . Chronic systolic CHF (congestive heart failure)     a. 12/2011 Echo: EF 35-40%, sev HK of mid-dist anteroseptal walls, mild MR, mildly dil LA, PASP .  . Colon cancer     a. 2009 s/p resection;  b. Remission, 2013  . PAF (paroxysmal atrial fibrillation)     a. 12/2011 in the setting of STEMI and shock; no anticoag due to h/o GI bleed and need for DAPT  . CKD (chronic kidney disease), stage IV    Past Surgical History  Procedure Date  . Colectomy    Allergies  No Known Allergies  HPI  79 year old female with the above complex problem list. Patient presented to [place] [facility] on December 22 with acute anterior ST elevation MI. She was transferred to [facility] for diagnostic catheterization showed severe bifurcation disease in the LAD which was treated with bare-metal stenting. Bare-metal stenting was chosen secondary to prior history of GI bleed. Tolerated procedure well however post catheterization developed recurrent left arm and shoulder pain requiring relook catheterization which showed continued patency of the LAD stent in otherwise nonobstructive disease. Echo showed EF of 35-40%. The chest x-ray was  concerning for left lower lobe pneumonia while urine culture grew out Escherichia coli. She was placed on antibiotics. She unfortunately became hypotensive and required vasopressor therapy as well as milrinone and what was felt to be cardiogenic and septic shock. She did require 1 unit packed red blood cells for hemoglobin of 7.9 and subsequent diuresis. She did have acute on chronic renal failure during admission however this stabilized. Finally, she also had paroxysmal atrial fibrillation which was managed with amiodarone. She was just discharged last week and has since been feeling well. She continues to have mild dyspnea on exertion but overall she feels that this is improving as is her strength. She has had no chest pain. Home health is coming out to her house twice a week as is the physical therapy.  Home Medications  Prior to Admission medications   Medication Sig Start Date End Date Taking? Authorizing Provider  amiodarone (PACERONE) 200 MG tablet Take 1 tablet (200 mg total) by mouth daily. 02/03/12  Yes Jessica A Hope, PA-C  aspirin 81 MG chewable tablet Chew 81 mg by mouth daily.   Yes Historical Provider, MD  atorvastatin (LIPITOR) 20 MG tablet Take 1 tablet (20 mg total) by mouth at bedtime. 02/03/12  Yes Jessica A Hope, PA-C  clopidogrel (PLAVIX) 75 MG tablet Take 1 tablet (75 mg total) by mouth daily with breakfast. 02/03/12  Yes Jessica A Hope, PA-C  furosemide (LASIX) 40 MG tablet Take 1 tablet (40 mg total) by mouth daily. 02/03/12  Yes Jessica A Hope,  PA-C  nitroGLYCERIN (NITROSTAT) 0.4 MG SL tablet Place 1 tablet (0.4 mg total) under the tongue every 5 (five) minutes as needed for chest pain (up to 3 doses). 02/03/12  Yes Jessica A Hope, PA-C  potassium chloride SA (K-DUR,KLOR-CON) 20 MEQ tablet Take 1 tablet (20 mEq total) by mouth daily. 02/03/12  Yes Jessica A Hope, PA-C  predniSONE (DELTASONE) 5 MG tablet Take 10mg  (2 tabs) once daily on 1/9 then take 5mg  (1 tab) daily on 1/10, 1/11, and  1/12, then stop. 02/03/12  Yes Jessica A Hope, PA-C  metoprolol succinate (TOPROL-XL) 50 MG 24 hr tablet Take 1 tablet (50 mg total) by mouth daily. Take with or immediately following a meal. 02/10/12   Ok Anis, NP   Review of Systems  She's had some dyspnea on exertion though notes that this is markedly improved. She has not had any chest pain. Physical therapy is coming out to her house twice a week and walking with her. Her strength is improving overall. She denies pnd, orthopnea, n, v, dizziness, syncope, edema, weight gain, or early satiety.  All other systems reviewed and are otherwise negative except as noted above.  Physical Exam  Blood pressure 120/70, pulse 79, height 5' (1.524 m), weight 139 lb 8 oz (63.277 kg).  General: Pleasant, NAD Psych: Normal affect. Neuro: Alert and oriented X 3. Moves all extremities spontaneously. HEENT: Normal  Neck: Supple without bruits or JVD. Lungs:  Resp regular and unlabored, CTA. Heart: RRR no s3, s4.  2/6 systolic murmur noted at the left upper sternal border. Abdomen: Soft, non-tender, non-distended, BS + x 4.  Extremities: No clubbing, cyanosis or edema. DP/PT/Radials 2+ and equal bilaterally.  Accessory Clinical Findings  ECG - regular sinus rhythm, 79, poor R-wave progression and lateral T-wave inversion. No acute ST or T changes.  Assessment & Plan  1.  Coronary artery disease:  Patient status post anterior ST elevation MI complicated by cardiogenic and septic shock briefly requiring vasopressor and inotropic therapy. She has recovered well and was discharged last week. She continues to have some weakness and dyspnea on exertion but overall feels that she is improving and has home health coming out to the house. She has not had any chest pain. Upon reviewing her medication list though it appears metoprolol was prescribed, she has not been taking it and per her advanced home care, she does not have a bottle for it. Given reduced  EF, we have sent in a prescription for Toprol-XL 50 mg daily. She'll remain on aspirin, statin, and Plavix therapy. She has not had any melena or bright red blood per rectum.  2. Ischemic cardiomyopathy/chronic systolic congestive heart failure: Her volume looks very good. She has been weighing herself each morning and has been running 137-139 pounds which is down from the mid 140s prior to hospitalization. She has weights in the 150s during hospitalization. As noted above, she had not been receiving beta blocker therapy and we have prescribed Toprol-XL 50 mg daily. She's not currently on ACE inhibitor, ARB, or aldosterone blocker therapy secondary to acute on chronic renal failure while hospitalized. We will followup a basic metabolic panel today and consider initiation of ACE inhibitor pending creatinine. We discussed the importance of daily weights, sodium restriction, medication and diet adherence, and symptom reporting. She'll need a followup echocardiogram in approximately 12 weeks.  3. Hypertension: Blood pressure is stable the notes from advance homecare report the she's been running in the 140s. As above, she had  not been taking metoprolol and this is been prescribed.  4. Paroxysmal atrial fibrillation: This occurred during hospitalization in the setting of acute illness. She is in sinus rhythm today and is not aware of any palpitations. She is on amiodarone at 200 mg daily. She had normal TSH and LFTs while hospitalized. If decision is made to pursue long-term amiodarone therapy, she will need pulmonary function testing. She will follow up with Dr. Kirke Corin in 3 weeks to discuss further. She is not on long-term anticoagulation as she was not felt to be a strong candidate for it in the setting of prior GI bleeding and ongoing dual antiplatelet therapy.  That said, she has a CHA2DS2 VASc of 6 and we can readdress this after she has completed 30 days of plavix.  5.  Hyperlipidemia: Followup lipids and  LFTs in 6-8 weeks.  6. Disposition: Followup with Dr. Kirke Corin in 3 wks.  Nicolasa Ducking, NP 02/10/2012, 11:29 AM

## 2012-02-10 NOTE — Patient Instructions (Addendum)
Your physician wants you to follow-up in: 4 weeks with Dr. Kirke Corin. You will receive a reminder letter in the mail two months in advance. If you don't receive a letter, please call our office to schedule the follow-up appointment.  Your physician has recommended you make the following change in your medication:  -start Toprol XL 50 mg daily

## 2012-02-11 LAB — BASIC METABOLIC PANEL
BUN: 17 mg/dL (ref 8–27)
CO2: 19 mmol/L (ref 19–28)
Calcium: 8.2 mg/dL — ABNORMAL LOW (ref 8.6–10.2)
Creatinine, Ser: 1.48 mg/dL — ABNORMAL HIGH (ref 0.57–1.00)

## 2012-02-27 ENCOUNTER — Ambulatory Visit: Payer: Self-pay | Admitting: Oncology

## 2012-03-07 LAB — FERRITIN: Ferritin (ARMC): 425 ng/mL — ABNORMAL HIGH (ref 8–388)

## 2012-03-07 LAB — CBC CANCER CENTER
Eosinophil #: 0.1 x10 3/mm (ref 0.0–0.7)
HCT: 28.7 % — ABNORMAL LOW (ref 35.0–47.0)
Lymphocyte #: 1.8 x10 3/mm (ref 1.0–3.6)
Lymphocyte %: 22.5 %
MCH: 27.7 pg (ref 26.0–34.0)
MCV: 84 fL (ref 80–100)
Monocyte %: 12.4 %
Neutrophil #: 5 x10 3/mm (ref 1.4–6.5)
Neutrophil %: 62.7 %
RBC: 3.43 10*6/uL — ABNORMAL LOW (ref 3.80–5.20)

## 2012-03-07 LAB — IRON AND TIBC
Iron Bind.Cap.(Total): 209 ug/dL — ABNORMAL LOW (ref 250–450)
Iron: 51 ug/dL (ref 50–170)
Unbound Iron-Bind.Cap.: 158 ug/dL

## 2012-03-11 ENCOUNTER — Ambulatory Visit: Payer: Medicare Other | Admitting: Cardiovascular Disease

## 2012-03-18 ENCOUNTER — Encounter: Payer: Self-pay | Admitting: Cardiovascular Disease

## 2012-03-18 ENCOUNTER — Ambulatory Visit (INDEPENDENT_AMBULATORY_CARE_PROVIDER_SITE_OTHER): Payer: Medicare Other | Admitting: Cardiovascular Disease

## 2012-03-18 VITALS — BP 120/60 | HR 73 | Ht 60.0 in | Wt 137.8 lb

## 2012-03-18 DIAGNOSIS — I2510 Atherosclerotic heart disease of native coronary artery without angina pectoris: Secondary | ICD-10-CM

## 2012-03-18 DIAGNOSIS — R0602 Shortness of breath: Secondary | ICD-10-CM

## 2012-03-18 DIAGNOSIS — E785 Hyperlipidemia, unspecified: Secondary | ICD-10-CM

## 2012-03-18 DIAGNOSIS — I509 Heart failure, unspecified: Secondary | ICD-10-CM

## 2012-03-18 DIAGNOSIS — I480 Paroxysmal atrial fibrillation: Secondary | ICD-10-CM

## 2012-03-18 DIAGNOSIS — I4891 Unspecified atrial fibrillation: Secondary | ICD-10-CM

## 2012-03-18 DIAGNOSIS — R079 Chest pain, unspecified: Secondary | ICD-10-CM

## 2012-03-18 DIAGNOSIS — I5022 Chronic systolic (congestive) heart failure: Secondary | ICD-10-CM

## 2012-03-18 MED ORDER — LISINOPRIL 5 MG PO TABS: 5.0000 mg | ORAL_TABLET | Freq: Every day | ORAL | Status: AC

## 2012-03-18 MED ORDER — AMIODARONE HCL 200 MG PO TABS: 100.0000 mg | ORAL_TABLET | Freq: Every day | ORAL | Status: AC

## 2012-03-18 NOTE — Progress Notes (Signed)
HPI  This is an 79 year old female who is here today for followup visit regarding coronary artery disease and chronic systolic heart failure due to ischemic cardiomyopathy.  Patient presented to [facility] on December 22 with acute anterior ST elevation MI. She was transferred to [facility] for diagnostic catheterization showed severe bifurcation disease in the LAD which was treated with bare-metal stenting. Bare-metal stenting was chosen secondary to prior history of GI bleed. Tolerated procedure well however post catheterization developed recurrent left arm and shoulder pain requiring relook catheterization which showed continued patency of the LAD stent in otherwise nonobstructive disease. Echo showed EF of 35-40%. Hospital course was complicated by pneumonia, urinary tract infection and cardiogenic shock requiring inotrope. She did require 1 unit packed red blood cells for hemoglobin of 7.9 and subsequent diuresis. She did have acute on chronic renal failure during admission however this stabilized. Finally, she also had paroxysmal atrial fibrillation which was managed with amiodarone.  She has been doing reasonably well but continues to have significant weakness. She hasn't started cardiac rehabilitation yet. She gets chest pain only when she turns on her left side. She has no chest pain with physical activities. She feels nauseous throughout the day especially after she takes her morning medications.  No Known Allergies   Current Outpatient Prescriptions on File Prior to Visit  Medication Sig Dispense Refill  . aspirin 81 MG chewable tablet Chew 81 mg by mouth daily.      Marland Kitchen atorvastatin (LIPITOR) 20 MG tablet Take 1 tablet (20 mg total) by mouth at bedtime.  30 tablet  6  . clopidogrel (PLAVIX) 75 MG tablet Take 1 tablet (75 mg total) by mouth daily with breakfast.  30 tablet  6  . furosemide (LASIX) 40 MG tablet Take 1 tablet (40 mg total) by mouth daily.  30 tablet  6  . metoprolol  succinate (TOPROL-XL) 50 MG 24 hr tablet Take 1 tablet (50 mg total) by mouth daily. Take with or immediately following a meal.  90 tablet  3  . nitroGLYCERIN (NITROSTAT) 0.4 MG SL tablet Place 1 tablet (0.4 mg total) under the tongue every 5 (five) minutes as needed for chest pain (up to 3 doses).  25 tablet  3  . potassium chloride SA (K-DUR,KLOR-CON) 20 MEQ tablet Take 1 tablet (20 mEq total) by mouth daily.  30 tablet  3   No current facility-administered medications on file prior to visit.     Past Medical History  Diagnosis Date  . Hypertension   . GI bleed     a. Likely due to diverticular bleed, June, 2013  . Chronic anemia   . CAD (coronary artery disease)     a. 01/17/12 STEMI/Cath/PCI: severe 2 site bifurcation LAD stenoses, otherwise nonobstructive CAD s/p overlapping BMS to mid/prox LAD.;  b. 12/2011 Relook Cath: patent stents, otw nonobs dzs.  . Ischemic cardiomyopathy     a. 12/2011 Echo:  EF 35-40%  . Chronic systolic CHF (congestive heart failure)     a. 12/2011 Echo: EF 35-40%, sev HK of mid-dist anteroseptal walls, mild MR, mildly dil LA, PASP .  . Colon cancer     a. 2009 s/p resection;  b. Remission, 2013  . PAF (paroxysmal atrial fibrillation)     a. 12/2011 in the setting of STEMI and shock; no anticoag due to h/o GI bleed and need for DAPT  . CKD (chronic kidney disease), stage IV      Past Surgical History  Procedure Laterality Date  . Colectomy       History reviewed. No pertinent family history.   History   Social History  . Marital Status: Unknown    Spouse Name: N/A    Number of Children: N/A  . Years of Education: N/A   Occupational History  . Not on file.   Social History Main Topics  . Smoking status: Former Smoker -- 0.25 packs/day for 6 years    Types: Cigarettes    Quit date: 01/19/1998  . Smokeless tobacco: Never Used  . Alcohol Use: No  . Drug Use: No  . Sexually Active:    Other Topics Concern  . Not on file    Social History Narrative  . No narrative on file      PHYSICAL EXAM   BP 120/60  Pulse 73  Ht 5' (1.524 m)  Wt 137 lb 12 oz (62.483 kg)  BMI 26.9 kg/m2 Constitutional: She is oriented to person, place, and time. She appears well-developed and well-nourished. No distress.  HENT: No nasal discharge.  Head: Normocephalic and atraumatic.  Eyes: Pupils are equal and round. Right eye exhibits no discharge. Left eye exhibits no discharge.  Neck: Normal range of motion. Neck supple. No JVD present. No thyromegaly present.  Cardiovascular: Normal rate, regular rhythm, normal heart sounds. Exam reveals no gallop and no friction rub. No murmur heard.  Pulmonary/Chest: Effort normal and breath sounds normal. No stridor. No respiratory distress. She has no wheezes. She has no rales. She exhibits no tenderness.  Abdominal: Soft. Bowel sounds are normal. She exhibits no distension. There is no tenderness. There is no rebound and no guarding.  Musculoskeletal: Normal range of motion. She exhibits no edema and no tenderness.  Neurological: She is alert and oriented to person, place, and time. Coordination normal.  Skin: Skin is warm and dry. No rash noted. She is not diaphoretic. No erythema. No pallor.  Psychiatric: She has a normal mood and affect. Her behavior is normal. Judgment and thought content normal.     EKG: Sinus  Rhythm  -Anteroseptal infarct -age undetermined.   -  Negative T-waves  -Possible  Anterolateral  ischemia.   ABNORMAL    ASSESSMENT AND PLAN

## 2012-03-18 NOTE — Assessment & Plan Note (Signed)
Ejection fraction was moderately reduced at 35-40%. She appears to be euvolemic and currently New York Heart Association class III but gradually improving. I will add small dose lisinopril 5 mg once daily and check basic metabolic profile in one week. She does have known history of chronic kidney disease with the most recent creatinine of 1.48. Due to that, I will likely avoid spironolactone.

## 2012-03-18 NOTE — Assessment & Plan Note (Addendum)
Continue treatment with atorvastatin.  She will need a followup lipid and liver profile in the near future.

## 2012-03-18 NOTE — Assessment & Plan Note (Signed)
She has atypical chest pain which seems to be positional. She has no exertional chest discomfort. Continue medical therapy. I will refer her to cardiac rehabilitation.

## 2012-03-18 NOTE — Assessment & Plan Note (Signed)
She is maintaining in sinus rhythm. Her nausea is likely a side effect of amiodarone. I will decrease the dose to 100 mg once daily. I will consider stopping this medication in few months if she remains in sinus rhythm. If she stays on this medication, she will need a followup liver and thyroid panel.

## 2012-03-18 NOTE — Patient Instructions (Addendum)
Decrease Amiodarone to 100 mg once daily (1/2 tablet).  Start Lisinopril 5 mg once daily.   Labs in 1 week.   Refer to cardiac rehab at [facility].   Follow up in 1 month.

## 2012-03-25 ENCOUNTER — Ambulatory Visit (INDEPENDENT_AMBULATORY_CARE_PROVIDER_SITE_OTHER): Payer: Medicare Other

## 2012-03-25 DIAGNOSIS — R0602 Shortness of breath: Secondary | ICD-10-CM

## 2012-03-26 ENCOUNTER — Ambulatory Visit: Payer: Self-pay | Admitting: Oncology

## 2012-03-26 LAB — BASIC METABOLIC PANEL
BUN: 23 mg/dL (ref 8–27)
CO2: 19 mmol/L (ref 19–28)
Calcium: 9 mg/dL (ref 8.6–10.2)
Creatinine, Ser: 1.5 mg/dL — ABNORMAL HIGH (ref 0.57–1.00)
Glucose: 93 mg/dL (ref 65–99)

## 2012-04-18 ENCOUNTER — Ambulatory Visit: Payer: Medicare Other | Admitting: Cardiovascular Disease

## 2012-05-02 ENCOUNTER — Ambulatory Visit: Payer: Medicare Other | Admitting: Cardiovascular Disease

## 2012-06-10 ENCOUNTER — Other Ambulatory Visit: Payer: Self-pay | Admitting: *Deleted

## 2012-06-10 MED ORDER — POTASSIUM CHLORIDE CRYS ER 20 MEQ PO TBCR: 20.0000 meq | EXTENDED_RELEASE_TABLET | Freq: Every day | ORAL | Status: DC

## 2012-06-26 ENCOUNTER — Ambulatory Visit: Payer: Self-pay | Admitting: Oncology

## 2012-07-11 LAB — CBC CANCER CENTER
Basophil #: 0 x10 3/mm (ref 0.0–0.1)
Basophil %: 0.5 %
Eosinophil #: 0.1 x10 3/mm (ref 0.0–0.7)
HCT: 28.8 % — ABNORMAL LOW (ref 35.0–47.0)
HGB: 9.3 g/dL — ABNORMAL LOW (ref 12.0–16.0)
Lymphocyte %: 32.1 %
MCH: 26.3 pg (ref 26.0–34.0)
MCV: 81 fL (ref 80–100)
Monocyte #: 0.8 x10 3/mm (ref 0.2–0.9)
Platelet: 177 x10 3/mm (ref 150–440)
WBC: 6.5 x10 3/mm (ref 3.6–11.0)

## 2012-07-11 LAB — IRON AND TIBC
Iron Bind.Cap.(Total): 236 ug/dL — ABNORMAL LOW (ref 250–450)
Iron Saturation: 31 %
Iron: 72 ug/dL (ref 50–170)
Unbound Iron-Bind.Cap.: 164 ug/dL

## 2012-07-26 ENCOUNTER — Ambulatory Visit: Payer: Self-pay | Admitting: Oncology

## 2012-09-08 ENCOUNTER — Other Ambulatory Visit (HOSPITAL_COMMUNITY): Payer: Self-pay | Admitting: Cardiology

## 2012-11-07 ENCOUNTER — Other Ambulatory Visit (HOSPITAL_COMMUNITY): Payer: Self-pay | Admitting: Cardiology

## 2012-11-18 ENCOUNTER — Telehealth: Payer: Self-pay

## 2012-11-18 NOTE — Telephone Encounter (Signed)
Received fax from cardiac rehab at [facility] that pt missed appt, "messages not returned after 2-3 calls/no answer (brochure was sent after that)."

## 2012-11-21 ENCOUNTER — Ambulatory Visit: Payer: Self-pay | Admitting: Oncology

## 2012-11-22 LAB — CBC CANCER CENTER
Basophil #: 0 x10 3/mm (ref 0.0–0.1)
Eosinophil %: 2.1 %
HCT: 30.1 % — ABNORMAL LOW (ref 35.0–47.0)
HGB: 9.6 g/dL — ABNORMAL LOW (ref 12.0–16.0)
Lymphocyte #: 1.7 x10 3/mm (ref 1.0–3.6)
Lymphocyte %: 26.8 %
MCH: 27.4 pg (ref 26.0–34.0)
Monocyte #: 0.8 x10 3/mm (ref 0.2–0.9)
Monocyte %: 13.3 %
Neutrophil %: 57 %
Platelet: 199 x10 3/mm (ref 150–440)
RBC: 3.52 10*6/uL — ABNORMAL LOW (ref 3.80–5.20)
RDW: 14 % (ref 11.5–14.5)
WBC: 6.2 x10 3/mm (ref 3.6–11.0)

## 2012-11-22 LAB — IRON AND TIBC: Unbound Iron-Bind.Cap.: 191 ug/dL

## 2012-11-26 ENCOUNTER — Ambulatory Visit: Payer: Self-pay | Admitting: Oncology

## 2012-11-29 ENCOUNTER — Ambulatory Visit: Payer: Self-pay | Admitting: Emergency Medicine

## 2013-01-04 ENCOUNTER — Other Ambulatory Visit: Payer: Self-pay | Admitting: Cardiology

## 2013-01-06 ENCOUNTER — Other Ambulatory Visit (HOSPITAL_COMMUNITY): Payer: Self-pay | Admitting: Cardiology

## 2013-01-08 ENCOUNTER — Ambulatory Visit: Payer: Self-pay

## 2013-01-13 ENCOUNTER — Ambulatory Visit: Payer: Self-pay | Admitting: Family Medicine

## 2013-01-13 LAB — CREATININE, SERUM
Creatinine: 1.65 mg/dL — ABNORMAL HIGH (ref 0.60–1.30)
EGFR (African American): 32 — ABNORMAL LOW

## 2013-01-18 ENCOUNTER — Ambulatory Visit: Payer: Self-pay | Admitting: Oncology

## 2013-01-18 LAB — CBC CANCER CENTER
Basophil %: 0.8 %
Eosinophil %: 2.9 %
HCT: 28.6 % — ABNORMAL LOW (ref 35.0–47.0)
MCV: 85 fL (ref 80–100)
Monocyte #: 0.9 x10 3/mm (ref 0.2–0.9)
Monocyte %: 13 %
Neutrophil #: 3.8 x10 3/mm (ref 1.4–6.5)
Neutrophil %: 55.4 %
RBC: 3.37 10*6/uL — ABNORMAL LOW (ref 3.80–5.20)
RDW: 13.5 % (ref 11.5–14.5)
WBC: 6.8 x10 3/mm (ref 3.6–11.0)

## 2013-01-18 LAB — FERRITIN: Ferritin (ARMC): 408 ng/mL — ABNORMAL HIGH (ref 8–388)

## 2013-01-18 LAB — IRON AND TIBC
Iron Saturation: 16 %
Iron: 35 ug/dL — ABNORMAL LOW (ref 50–170)

## 2013-01-25 ENCOUNTER — Ambulatory Visit: Payer: Self-pay | Admitting: Family Medicine

## 2013-01-26 ENCOUNTER — Ambulatory Visit: Payer: Self-pay | Admitting: Oncology

## 2013-02-01 LAB — CBC CANCER CENTER
Basophil #: 0.1 x10 3/mm (ref 0.0–0.1)
Basophil %: 1 %
EOS ABS: 0.2 x10 3/mm (ref 0.0–0.7)
Eosinophil %: 2 %
HCT: 29.8 % — ABNORMAL LOW (ref 35.0–47.0)
HGB: 9.4 g/dL — AB (ref 12.0–16.0)
LYMPHS ABS: 1.8 x10 3/mm (ref 1.0–3.6)
Lymphocyte %: 22.7 %
MCH: 26.5 pg (ref 26.0–34.0)
MCHC: 31.5 g/dL — AB (ref 32.0–36.0)
MCV: 84 fL (ref 80–100)
MONO ABS: 1.1 x10 3/mm — AB (ref 0.2–0.9)
Monocyte %: 13.9 %
NEUTROS ABS: 4.8 x10 3/mm (ref 1.4–6.5)
Neutrophil %: 60.4 %
PLATELETS: 286 x10 3/mm (ref 150–440)
RBC: 3.53 10*6/uL — ABNORMAL LOW (ref 3.80–5.20)
RDW: 14.4 % (ref 11.5–14.5)
WBC: 8 x10 3/mm (ref 3.6–11.0)

## 2013-02-07 ENCOUNTER — Ambulatory Visit: Payer: Self-pay | Admitting: Oncology

## 2013-02-07 LAB — APTT: ACTIVATED PTT: 37.7 s — AB (ref 23.6–35.9)

## 2013-02-07 LAB — PROTIME-INR
INR: 1.3
Prothrombin Time: 15.8 secs — ABNORMAL HIGH (ref 11.5–14.7)

## 2013-02-09 ENCOUNTER — Other Ambulatory Visit: Payer: Self-pay | Admitting: Cardiovascular Disease

## 2013-02-26 ENCOUNTER — Ambulatory Visit: Payer: Self-pay | Admitting: Oncology

## 2013-02-28 LAB — PATHOLOGY REPORT

## 2013-03-04 ENCOUNTER — Inpatient Hospital Stay: Payer: Self-pay | Admitting: Internal Medicine

## 2013-03-04 LAB — COMPREHENSIVE METABOLIC PANEL
ALBUMIN: 2.2 g/dL — AB (ref 3.4–5.0)
AST: 23 U/L (ref 15–37)
Alkaline Phosphatase: 82 U/L
Anion Gap: 6 — ABNORMAL LOW (ref 7–16)
BUN: 46 mg/dL — ABNORMAL HIGH (ref 7–18)
Bilirubin,Total: 0.4 mg/dL (ref 0.2–1.0)
CALCIUM: 8.9 mg/dL (ref 8.5–10.1)
CREATININE: 3 mg/dL — AB (ref 0.60–1.30)
Chloride: 105 mmol/L (ref 98–107)
Co2: 23 mmol/L (ref 21–32)
EGFR (African American): 16 — ABNORMAL LOW
EGFR (Non-African Amer.): 14 — ABNORMAL LOW
Glucose: 172 mg/dL — ABNORMAL HIGH (ref 65–99)
Osmolality: 284 (ref 275–301)
Potassium: 5.3 mmol/L — ABNORMAL HIGH (ref 3.5–5.1)
SGPT (ALT): 26 U/L (ref 12–78)
Sodium: 134 mmol/L — ABNORMAL LOW (ref 136–145)
Total Protein: 7.3 g/dL (ref 6.4–8.2)

## 2013-03-04 LAB — TROPONIN I: Troponin-I: 0.03 ng/mL

## 2013-03-04 LAB — CK TOTAL AND CKMB (NOT AT ARMC)
CK, TOTAL: 30 U/L
CK, Total: 33 U/L
CK, Total: 36 U/L
CK-MB: 1 ng/mL (ref 0.5–3.6)
CK-MB: 1.2 ng/mL (ref 0.5–3.6)

## 2013-03-04 LAB — CBC WITH DIFFERENTIAL/PLATELET
BASOS PCT: 0.8 %
Basophil #: 0.1 10*3/uL (ref 0.0–0.1)
EOS ABS: 0 10*3/uL (ref 0.0–0.7)
EOS PCT: 0.2 %
HCT: 28.3 % — ABNORMAL LOW (ref 35.0–47.0)
HGB: 8.9 g/dL — ABNORMAL LOW (ref 12.0–16.0)
LYMPHS PCT: 5.3 %
Lymphocyte #: 0.8 10*3/uL — ABNORMAL LOW (ref 1.0–3.6)
MCH: 25.9 pg — ABNORMAL LOW (ref 26.0–34.0)
MCHC: 31.6 g/dL — AB (ref 32.0–36.0)
MCV: 82 fL (ref 80–100)
MONOS PCT: 5.3 %
Monocyte #: 0.8 x10 3/mm (ref 0.2–0.9)
NEUTROS ABS: 13.5 10*3/uL — AB (ref 1.4–6.5)
NEUTROS PCT: 88.4 %
Platelet: 410 10*3/uL (ref 150–440)
RBC: 3.45 10*6/uL — AB (ref 3.80–5.20)
RDW: 15.3 % — ABNORMAL HIGH (ref 11.5–14.5)
WBC: 15.3 10*3/uL — ABNORMAL HIGH (ref 3.6–11.0)

## 2013-03-04 LAB — PROTIME-INR
INR: 1.4
Prothrombin Time: 16.8 secs — ABNORMAL HIGH (ref 11.5–14.7)

## 2013-03-04 LAB — URINALYSIS, COMPLETE
BILIRUBIN, UR: NEGATIVE
GLUCOSE, UR: NEGATIVE mg/dL (ref 0–75)
KETONE: NEGATIVE
Nitrite: POSITIVE
Ph: 5 (ref 4.5–8.0)
Protein: NEGATIVE
Specific Gravity: 1.017 (ref 1.003–1.030)

## 2013-03-04 LAB — MAGNESIUM: Magnesium: 2.5 mg/dL — ABNORMAL HIGH

## 2013-03-04 LAB — PHOSPHORUS: Phosphorus: 3.8 mg/dL (ref 2.5–4.9)

## 2013-03-05 DIAGNOSIS — I059 Rheumatic mitral valve disease, unspecified: Secondary | ICD-10-CM

## 2013-03-05 LAB — COMPREHENSIVE METABOLIC PANEL
ANION GAP: 4 — AB (ref 7–16)
AST: 15 U/L (ref 15–37)
Albumin: 1.7 g/dL — ABNORMAL LOW (ref 3.4–5.0)
Alkaline Phosphatase: 69 U/L
BUN: 43 mg/dL — ABNORMAL HIGH (ref 7–18)
Bilirubin,Total: 0.3 mg/dL (ref 0.2–1.0)
CALCIUM: 8.4 mg/dL — AB (ref 8.5–10.1)
Chloride: 109 mmol/L — ABNORMAL HIGH (ref 98–107)
Co2: 23 mmol/L (ref 21–32)
Creatinine: 2.62 mg/dL — ABNORMAL HIGH (ref 0.60–1.30)
EGFR (African American): 19 — ABNORMAL LOW
EGFR (Non-African Amer.): 16 — ABNORMAL LOW
Glucose: 88 mg/dL (ref 65–99)
OSMOLALITY: 282 (ref 275–301)
POTASSIUM: 5.4 mmol/L — AB (ref 3.5–5.1)
SGPT (ALT): 17 U/L (ref 12–78)
Sodium: 136 mmol/L (ref 136–145)
TOTAL PROTEIN: 6 g/dL — AB (ref 6.4–8.2)

## 2013-03-05 LAB — BODY FLUID CELL COUNT WITH DIFFERENTIAL
Basophil: 0 %
EOS PCT: 0 %
Lymphocytes: 16 %
Neutrophils: 66 %
Nucleated Cell Count: 682 /mm3
Other Cells BF: 3 %
Other Mononuclear Cells: 15 %

## 2013-03-05 LAB — CBC WITH DIFFERENTIAL/PLATELET
Basophil #: 0.1 10*3/uL (ref 0.0–0.1)
Basophil %: 0.4 %
Eosinophil #: 0.1 10*3/uL (ref 0.0–0.7)
Eosinophil %: 0.8 %
HCT: 24.4 % — ABNORMAL LOW (ref 35.0–47.0)
HGB: 7.6 g/dL — ABNORMAL LOW (ref 12.0–16.0)
LYMPHS ABS: 1.4 10*3/uL (ref 1.0–3.6)
Lymphocyte %: 10.2 %
MCH: 25.4 pg — AB (ref 26.0–34.0)
MCHC: 31.2 g/dL — AB (ref 32.0–36.0)
MCV: 82 fL (ref 80–100)
MONO ABS: 1.5 x10 3/mm — AB (ref 0.2–0.9)
MONOS PCT: 10.8 %
NEUTROS ABS: 10.8 10*3/uL — AB (ref 1.4–6.5)
Neutrophil %: 77.8 %
Platelet: 347 10*3/uL (ref 150–440)
RBC: 2.99 10*6/uL — ABNORMAL LOW (ref 3.80–5.20)
RDW: 15.1 % — AB (ref 11.5–14.5)
WBC: 13.8 10*3/uL — ABNORMAL HIGH (ref 3.6–11.0)

## 2013-03-05 LAB — PROTIME-INR
INR: 1.3
Prothrombin Time: 16.2 secs — ABNORMAL HIGH (ref 11.5–14.7)

## 2013-03-06 LAB — BASIC METABOLIC PANEL
Anion Gap: 6 — ABNORMAL LOW (ref 7–16)
BUN: 39 mg/dL — ABNORMAL HIGH (ref 7–18)
CALCIUM: 8.3 mg/dL — AB (ref 8.5–10.1)
Chloride: 114 mmol/L — ABNORMAL HIGH (ref 98–107)
Co2: 18 mmol/L — ABNORMAL LOW (ref 21–32)
Creatinine: 2.19 mg/dL — ABNORMAL HIGH (ref 0.60–1.30)
EGFR (African American): 23 — ABNORMAL LOW
EGFR (Non-African Amer.): 20 — ABNORMAL LOW
GLUCOSE: 77 mg/dL (ref 65–99)
Osmolality: 284 (ref 275–301)
Potassium: 4.8 mmol/L (ref 3.5–5.1)
Sodium: 138 mmol/L (ref 136–145)

## 2013-03-07 LAB — BASIC METABOLIC PANEL
Anion Gap: 6 — ABNORMAL LOW (ref 7–16)
BUN: 30 mg/dL — ABNORMAL HIGH (ref 7–18)
Calcium, Total: 8.1 mg/dL — ABNORMAL LOW (ref 8.5–10.1)
Chloride: 117 mmol/L — ABNORMAL HIGH (ref 98–107)
Co2: 19 mmol/L — ABNORMAL LOW (ref 21–32)
Creatinine: 1.53 mg/dL — ABNORMAL HIGH (ref 0.60–1.30)
EGFR (African American): 36 — ABNORMAL LOW
EGFR (Non-African Amer.): 31 — ABNORMAL LOW
GLUCOSE: 83 mg/dL (ref 65–99)
Osmolality: 288 (ref 275–301)
Potassium: 4.1 mmol/L (ref 3.5–5.1)
Sodium: 142 mmol/L (ref 136–145)

## 2013-03-08 LAB — URINE CULTURE

## 2013-03-09 LAB — BODY FLUID CULTURE

## 2013-03-26 ENCOUNTER — Ambulatory Visit: Payer: Self-pay | Admitting: Oncology

## 2013-03-26 DEATH — deceased

## 2014-01-04 ENCOUNTER — Encounter (HOSPITAL_COMMUNITY): Payer: Self-pay | Admitting: Cardiology

## 2014-05-15 NOTE — Discharge Summary (Signed)
PATIENT NAME:  [name] Green DATE OF BIRTH:  02/01/1925  CONSULTANTS:  Kathlyn Sacramento, MD, from cardiology.  PRIMARY CARE PHYSICIAN:  Domenick Gong, MD  CHIEF COMPLAINT:  Chest pain.   DISCHARGE DIAGNOSES: 1.  Acute anterior ST-elevation myocardial infarction.  2.  Hypotension currently, will be started on dopamine.   SECONDARY DIAGNOSES: 1.  History of hypertension. 2.  History of lower gastrointestinal bleed earlier this year, which is thought to be diverticular in nature.  3.  Chronic anemia.  4.  History of colon cancer status post resection in 2009. 5.  History of gastritis.   CURRENT MEDICATIONS: Metoprolol 25 mg b.i.d., aspirin 81 mg daily. morphine p.r.n.,  nitroglycerin sublingual p.r.n., hydralazine 10 mg IV q. 4 hours p.r.n., Zofran IV p.r.n. The patient will be started on dopamine here prior to transfer.  HISTORY OF PRESENT ILLNESS AND BRIEF HOSPITAL COURSE:  For full details of the H and P, please see the dictation on December 21 by Dr. [name] but briefly, this is an 81 year old female who presented with shortness of breath, chest pain and some nausea yesterday and was noted to have a troponin of 0.08 and no significant ST elevations or depressions on the EKG. She was admitted to the hospitalist service on telemetry. She was started on aspirin, statin and a beta blocker. Her enzymes were cycled, which did trend up. Her initial troponin was 0.05, the next one went up to 0.19 and earlier in the morning it was 0.63. The patient did develop off and on chest pains/chest pressure with involvement of shoulders, some shooting to the back, as well as nausea and diaphoresis.   The patient had presented to the hospital with a significant elevation of blood pressure. Of note, initial vitals were temperature 97.5, pulse 66 and blood pressure was 208/82 and it did go down gradually. Initially it was thought that the elevation of the troponin was demand ischemia related secondary to  malignant hypertension more so than acute coronary syndrome, but the morning of the 22nd, as the troponin was going up and patient had worsening symptoms, the case was discussed with cardiology. She was started on a heparin drip, morphine and sublingual nitroglycerin. The patient did receive nitroglycerin sublingual x 1 and morphine IV x 1 for this. The patient became hypotensive with systolic in the 30S and 92Z and received some IV fluid boluses and at this point, dopamine is being initiated. An EKG of this morning does show acute ST-elevation MI in anterior leads with some ST depressions in inferior leads. Cardiology has gotten in touch with [facility] and activated the cath lab for the acute ST-elevation MI, and the patient is being transferred there for emergent cardiac catheterization. Accepting physician is Dr. Ellyn Hack.    CODE STATUS:  The patient is full code.   CRITICAL CARE TIME:  35 minutes.     ____________________________ [name] Presto, MD sa:ct D: 01/17/2012 11:33:00 ET T: 01/17/2012 12:14:50 ET JOB#: [id number]  cc: Fonnie Jarvis. [name] Qua, MD [name] Presto, MD, <Dictator>   [name] Presto MD ELECTRONICALLY SIGNED 01/26/2012 14:12

## 2014-05-15 NOTE — H&P (Signed)
PATIENT NAME:  [name] Green, RALLS MR#:  [id number] DATE OF BIRTH:  19-Aug-1925  DATE OF ADMISSION:  01/16/2012  PRIMARY CARE PHYSICIAN: Dr. Domenick Gong   CHIEF COMPLAINT: Chest pain, shortness of breath.   HISTORY OF PRESENT ILLNESS: The patient is an 81 year old female with history of colon cancer, in remission, and history of hypertension, came in because of shortness of breath. The patient went shopping, then she felt shortness of breath. She lives with her son, but while going back to her house she felt more pain around her shoulder area with sweating, so she went to her daughter's house and then she came here. The patient was found to have slightly elevated troponin up to 0.08, and I am asked to admit the patient. The patient right now is chest pain free.   PAST MEDICAL HISTORY: Significant for admission in June for GI bleed, likely diverticular bleed, hypertension, chronic anemia, colon cancer status post resection in 2009, high blood pressure and gastritis.   PAST SURGICAL HISTORY: Partial colectomy, tonsillectomy, cataract surgery.   ALLERGIES: No known allergies.   MEDICATIONS: She is on metoprolol tartrate 25 mg p.o. b.i.d., Omeprazole 20 mg p.o. b.i.d.,  aspirin 81 mg daily.   SOCIAL HISTORY: No smoking, no drinking. She lives with a son and daughter-in-law.   REVIEW OF SYSTEMS:  CONSTITUTIONAL: No fever.  EYES: No blurred vision.  ENT: No tinnitus. No ear pain. No epistaxis.  RESPIRATORY: No cough. No wheezing. The patient had trouble breathing this afternoon. CARDIOVASCULAR: No chest pain. No orthopnea.  GASTROINTESTINAL: No nausea, no vomiting. No abdominal pain at this time.   GENITOURINARY: No dysuria.   ENDOCRINE: No polyuria or nocturia.  HEMATOLOGIC: No anemia.  INTEGUMENTARY: No skin rashes.  MUSCULOSKELETAL: Complains of joint pains and swelling in the legs recently.  NEUROLOGICAL: No numbness or weakness.  PSYCHIATRIC: No anxiety or insomnia.   PHYSICAL  EXAMINATION: VITAL SIGNS: The patient's temperature is 97.5, pulse is 66, respirations 20, blood pressure 208/82 when she came.  GENERAL: She is alert, awake, oriented.  HEENT: Head: Atraumatic, normocephalic. Pupils are equally reacting to light. Extraocular movements are intact. ENT: No tympanic membrane congestion. No turbinate hypertrophy. No oropharyngeal erythema.  NECK: Normal range of motion. No JVD. No carotid bruits.  CARDIOVASCULAR: S1 and S2 regular. No murmurs. PMI not displaced, and peripheral pulses intact.  LUNGS: Clear to auscultation. No wheeze. No rales.  ABDOMEN: Soft, nontender, nondistended. Bowel sound present,no hernias EXTREMITIES: The patient does have 1+ pitting edema. No cyanosis. No clubbing.  NEUROLOGICAL: Alert, awake, oriented. Cranial nerves II through XII are intact. Power 5 out of 5 in upper and lower extremities. Sensations are intact. DTRs are 2+ bilaterally.  LABORATORY, DIAGNOSTIC AND RADIOLOGICAL DATA:  Chest x-ray showed no acute disease of the chest.  CK total is 75, 2 CPK-MB less than 0.5.  EKG shows normal sinus at 63 beats per minute and no ST-T changes.   Electrolytes: Sodium 141, potassium 4.1, chloride 110, bicarbonate 24, BUN 16, creatinine 0.98, glucose 95, hemoglobin is 11. Troponin is 0.08.   ASSESSMENT AND PLAN: This is an 81 year old female patient who came in with trouble breathing and chest pain and sweating. The patient has slightly elevated troponins, but her blood pressure was very elevated when she came. Her symptoms are probably secondary to malignant hypertension more than acute coronary syndrome. The patient has metoprolol 25 mg p.o. b.i.d., continue that, and also add Lasix because she complains of leg edema, so Lasix 40 mg daily;  also we will give hydralazine 20 q. 6 hours p.r.n. If needed, we are going to start her on p.o. hydralazine tomorrow. Monitor her on telemetry.   TIME SPENT ON HISTORY AND PHYSICAL: About 60 minutes.    ____________________________ Epifanio Lesches, MD sk:cb D: 01/16/2012 19:29:38 ET T: 01/16/2012 22:31:30 ET JOB#: [id number]  cc: Epifanio Lesches, MD, <Dictator> Epifanio Lesches MD ELECTRONICALLY SIGNED 01/17/2012 19:41

## 2014-05-15 NOTE — H&P (Signed)
PATIENT NAME:  [name] Green, [name] Green MR#:  [id number] DATE OF BIRTH:  1925/07/05  DATE OF ADMISSION:  01/16/2012  ADDENDUM:  EKG shows normal sinus rhythm with no ST-T changes and 63 beats per minute.   ____________________________ Epifanio Lesches, MD sk:cb D: 01/16/2012 19:30:20 ET T: 01/16/2012 22:52:09 ET JOB#: [id number]  cc: Epifanio Lesches, MD, <Dictator> Epifanio Lesches MD ELECTRONICALLY SIGNED 01/17/2012 19:40

## 2014-05-18 NOTE — Consult Note (Signed)
PATIENT NAME:  [name] Green, [name] Green MR#:  [id number] DATE OF BIRTH:  Mar 22, 1925  DATE OF CONSULTATION:  01/17/2012  REQUESTING PHYSICIAN:  Epifanio Lesches, MD CONSULTING PHYSICIAN:  Colleen Kotlarz A. Fletcher Anon, MD  PRIMARY CARE PHYSICIAN:  Domenick Gong, MD  REASON FOR CONSULTATION: Myocardial infarction.   HISTORY OF PRESENT ILLNESS: This is a pleasant 81 year old Caucasian female with history of colon cancer in remission, history of hypertension and no other previous cardiac history. She is overall healthy and active. She was shopping yesterday and all of a sudden started having shortness of breath with some upper chest discomfort radiating to her back and shoulders. She also was noted to be sweating profusely. The patient never had any previous similar symptoms. She was brought to the Emergency Room and was found to have a slightly elevated troponin at 0.08 and was chest pain free at that time. Subsequent troponin increased to 0.19 and the third troponin was 0.63.  ECG showed no significant ST changes on presentation.  Overnight here, the patient had some more chest discomfort, which got worse in the morning. I saw the patient after she had received nitroglycerin and morphine 2 mg IV for chest discomfort. When I saw her, she was hypotensive and in significant distress. A STAT ECG was performed which showed new ST elevation in the anterior and anterolateral leads, about 2 to 3 mm, with reciprocal ST depression in the inferior leads.   PAST MEDICAL HISTORY:  GI bleed in June of this year, hypertension, chronic anemia, history of colon cancer status post resection and gastritis.   PAST SURGICAL HISTORY:   Includes partial colectomy, tonsillectomy and cataract surgery.   ALLERGIES: No known drug allergies.   HOME MEDICATIONS:  Include metoprolol 25 mg twice daily, omeprazole 20 mg twice daily and aspirin 81 mg once daily.   SOCIAL HISTORY:  Negative for smoking, alcohol or recreational drug use.   REVIEW OF  SYSTEMS:  A 10-point review of systems was performed. It is negative other than what is mentioned in the HPI.   PHYSICAL EXAMINATION: GENERAL: The patient appears to be in significant distress due to pain.  VITAL SIGNS: Temperature 97.2, pulse 80, blood pressure 72/54, and oxygen saturation is 98% on nonrebreather.  HEENT: Normocephalic, atraumatic.  NECK: No jugular venous distention or carotid bruits.  RESPIRATORY: Normal respiratory effort with no use of accessory muscles. Auscultation reveals normal breath sounds.  CARDIOVASCULAR: Normal PMI. Normal S1 and S2 with no gallops or murmurs.  ABDOMEN: Benign, nontender and nondistended.  EXTREMITIES: No clubbing, cyanosis or edema.  SKIN: Warm and dry with no rash.  PSYCHIATRIC: The patient is in significant distress at this time.   LABORATORY AND DIAGNOSTIC DATA:  Troponin, as mentioned above, increased from 0.08 to 0.63 with a CK-MB of 2.8. Creatinine was 0.98. Hemoglobin was 11. ECG on presentation showed sinus rhythm with nonspecific ST changes. Repeat ECG this morning showed anterolateral ST elevation with reciprocal ST depression in the inferior leads.   IMPRESSION: 1.  Acute anterolateral ST-elevation myocardial infarction.  2.  Hypotension, likely due to myocardial stunning from #1. 3.  History of hypertension.  4.  History of gastrointestinal bleed.   RECOMMENDATIONS: A code STEMI was called and I discussed the case with Dr. Ellyn Hack, who is on call at [facility]. He accepted the patient for emergent cardiac catheterization. [facility] were notified and will be transferring the patient.  The patient was given aspirin as well as unfractionated heparin.  She was given IV fluid  boluses for hypotension and ultimately was started on small dose dopamine 3 mcg/kg per minute with a blood pressure before transfer of 110/70.  Given the patient's previous history of GI bleed, a bare metal stent is recommended.  I will be discussing this  with Dr. Ellyn Hack.     ____________________________ Mertie Clause. Fletcher Anon, MD maa:ct D: 01/17/2012 11:48:46 ET T: 01/17/2012 12:29:13 ET JOB#: [id number]  cc: Jacobie Stamey A. Fletcher Anon, MD, <Dictator> Fonnie Jarvis. Ilene Qua, MD Epifanio Lesches, MD  Rogue Jury Ferne Reus MD ELECTRONICALLY SIGNED 02/16/2012 17:35

## 2014-05-19 NOTE — Discharge Summary (Signed)
PATIENT NAME:  [name] Green, [name] Green MR#:  [id number] DATE OF BIRTH:  01-05-1926  DATE OF ADMISSION:  03/04/2013 DATE OF DISCHARGE:  03/07/2013  PRESENTING COMPLAINT: Syncope, with enlarging pleural effusion.   DISCHARGE DIAGNOSES: 1.  Syncope, suspected due to dehydration with acute renal failure, improved.  2.  Large left pleural effusion status post thoracentesis, suspected due to known history of lung cancer.  3.  Hypertension.  4.  Generalized weakness.   PROCEDURES: Left-sided ultrasound-guided thoracentesis.   CODE STATUS: No code, DO NOT RESUSCITATE.   FOLLOWUP: With Dr. Grayland Ormond in 1 to 2 weeks.   DIET: Low sodium diet.   MEDICATIONS: 1.  Ferrous sulfate 325, 1 tablet 2 to 3 times a week.  2.  Plavix 75 mg daily.  3.  Acetaminophen/hydrocodone 325/5, 1 tablet every 4 to 6 hours as needed.  4.  Zofran 8 mg p.o. t.i.d. as needed.  5.  Meclizine 25 mg 3 times a day.  6.  Aspirin 81 mg daily.  7.  Nitroglycerin 0.4 mg per transdermal patch daily.  8.  K-Dur 20 mEq p.o. daily.  9.  Pravastatin 40 mg at bedtime.  10.  Lisinopril 5 mg p.o. daily.  11.  Protonix 40 mg delayed-release p.o. daily.  12.  Lasix 20 mg every 48 hours.  13.  Keflex 250 p.o. b.i.d.  Home hospice to follow.   BRIEF SUMMARY OF HOSPITAL COURSE:  [name] is an 81 year old Caucasian female with past medical history of lung cancer, came in with:  1.  Syncopal episode which was presumed vasovagal versus due to dehydration from renal failure. She was given IV fluids. Blood pressure stabilized. Carotid Doppler did not show anything acute. CT head remained negative.  2.   Enlarging left pleural effusion in the setting of metastatic adenocarcinoma. She is status post 1.2 liters left-sided thoracentesis. Sats remained 91% to 92% on room air. Oncology recommended no further therapy given, given poor performance status. Family agrees with home with hospice.  3.  Acute renal failure, came in with creatinine of 3.10,  came down to 1.53 with intravenous hydration.  4.    Hyperkalemia due to acute renal failure, resolved.  5.  Anemia of chronic disease. No active bleeding, appears due to chronic illness, serum iron stores as above, lower side.  6.   Acute cystitis. Received IV Rocephin.  7.  Palliative care was consulted. The patient is a DO NOT RESUSCITATE. Family was agreeable with home with hospice.    Time spent:  40 minutes.    ____________________________ Hart Rochester Posey Pronto, MD sap:NTS D: 03/16/2013 25:85:27 ET T: 03/17/2013 01:53:42 ET JOB#: [id number]  cc: Adolfo Granieri A. Posey Pronto, MD, <Dictator> Ilda Basset MD ELECTRONICALLY SIGNED 03/26/2013 13:36

## 2014-05-19 NOTE — H&P (Signed)
PATIENT NAME:  [name] Green, [name] Green MR#:  [id number] DATE OF BIRTH:  Apr 11, 1925  DATE OF ADMISSION:  03/04/2013  REFERRING PHYSICIAN:  Lenise Arena, MD.   FAMILY PHYSICIAN:  Dr. Domenick Gong.   REASON FOR ADMISSION:  Syncope with an enlarging pleural effusion.   HISTORY OF PRESENT ILLNESS:  The patient is an 81 year old female followed by Dr. Ilene Qua. The patient has a history of colon cancer, status post colectomy. Most recently, the patient was diagnosed with metastatic lung cancer and has been seen by Dr. Grayland Ormond. No treatment has been initiated and she missed her last appointment with Dr. Grayland Ormond. Presents to the Emergency Room now after a syncopal episode this morning. Has not been eating and drinking. In the Emergency Room, the patient was noted to be in acute renal failure with cystitis. X-ray revealed enlarging left pleural effusion. She is now admitted for further evaluation.   PAST MEDICAL HISTORY:  1.  Newly diagnosed metastatic lung cancer.  2.  ASCD, status post MI.  3.  Status post PTCA with stent placement.  4.  A questionable history of atrial fibrillation.  5.  Benign hypertension.  6.  Hiatal hernia.  7.  Colon cancer, status post colectomy.  8.  Status post cataract surgery.   MEDICATIONS:  1.  Potassium chloride 20 mEq p.o. daily.  2.  Zofran 8 mg p.o. q.8 hours p.r.n. nausea and vomiting.  3.  Nitro-Dur 0.1 mg topically daily.  4.  Lopressor 50 mg p.o. b.i.d.  5.  Plavix 75 mg p.o. daily.  6.  Meclizine 25 mg p.o. t.i.d.  7.  Lasix 40 mg p.o. daily.  8.  Flonase 2 puffs each nostril daily.  9.  Iron sulfate 325 mg p.o. daily.  10.  Lipitor 20 mg p.o. daily,  11.  Aspirin 81 mg p.o. daily.  12.  Amiodarone 200 mg p.o. daily.  13.  Norco 5/325 mg 1 p.o. q.4 hours p.r.n. pain.   ALLERGIES:  No known drug allergies.   SOCIAL HISTORY:  The patient denies alcohol abuse. Used to smoke but none recently.   FAMILY HISTORY:  Positive for stroke and coronary  artery disease. Negative for breast or colon cancer.   REVIEW OF SYSTEMS: CONSTITUTIONAL:  No fever or change in weight.  EYES:  No blurred or double vision. No glaucoma.  ENT:  No tinnitus or hearing loss. No nasal discharge or bleeding. No difficulty swallowing.  RESPIRATORY:  Has had cough but no wheezing or hemoptysis. No painful respiration.  CARDIOVASCULAR:  No chest pain or orthopnea. No palpitations.  GASTROINTESTINAL:  Some nausea but no vomiting or diarrhea. No abdominal pain.  GENITOURINARY:  No dysuria or hematuria. No incontinence.  ENDOCRINE:  No polyuria or polydipsia. No heat or cold intolerance.  HEMATOLOGIC:  Admits to anemia. No bruising or bleeding.  LYMPHATIC:  No swollen glands.  MUSCULOSKELETAL:  Has some back pain, but denies neck, shoulder, knee or hip pain. No gout.  NEUROLOGIC:  No numbness or migraines. Denies stroke or seizures.  PSYCHIATRIC:  Denies anxiety, insomnia or depression.   PHYSICAL EXAMINATION:  GENERAL:  Elderly, chronically ill-appearing, in no acute distress.  VITAL SIGNS:  Currently remarkable for a blood pressure of 99/48, heart rate of 95, respiratory rate of 20, temperature of 98.7, sat at 97% on room air.  HEENT:  Normocephalic, atraumatic. Pupils equally round and reactive to light and accommodation. Extraocular movements are intact. Sclerae anicteric. Conjunctivae are clear.  OROPHARYNX: Clear.  NECK:  Supple  without JVD. No adenopathy or thyromegaly is noted.   LUNGS:  Decreased breath sounds on the left with basilar rhonchi on the right. Respiratory effort is normal. No wheezes or rales. There is dullness at the left lung.  CARDIAC:  Regular rate and rhythm. Normal S1 and S2. There is a 2/6 systolic murmur noted. No rubs or gallops are present. PMI is nondisplaced. Chest wall is nontender.  ABDOMEN:  Soft, nontender, with normoactive bowel sounds. No organomegaly or masses were appreciated. No hernias or bruits were noted.  EXTREMITIES:   Without clubbing, cyanosis or edema. Pulses were 2+ bilaterally.  SKIN:  Warm and dry without rash or lesions.  NEUROLOGIC:  Cranial nerves II through XII grossly intact. Deep tendon reflexes were symmetric. Motor and sensory exam is nonfocal.  PSYCHIATRIC:  Alert and oriented to person, place and time. She was cooperative and used good judgment.   LABORATORY DATA:  EKG revealed sinus tachycardia with an old septal infarct and lateral ST-T wave changes. Chest x-ray revealed an enlarging left pleural effusion. Lactic acid was elevated at 2.5. Pro-time was 16.8 with an INR of 1.4. White count was 15.3 with a hemoglobin of 8.9. Glucose 172 with a BUN of 46, creatinine of 3.00 with a GFR of 14. Sodium 134 with a potassium of 5.3. Troponin was less than 0.02. Urinalysis did reveal 3+ bacteria and trace leukocyte esterase.   ASSESSMENT:  1.  Syncope, presumably vasovagal.  2.  Enlarging left pleural effusion.  3.  Metastatic lung cancer, adenocarcinoma.  4.  Acute renal failure.  5.  Hyperkalemia.  6.  Anemia of chronic disease.  7.  Cystitis.  8.  Hyperglycemia.   PLAN:  The patient will be admitted to telemetry as a FULL CODE according to her wishes at this time. We will obtain consults from Oncology and Palliative Care. Because of her syncope, she will be maintained on telemetry. We will follow serial cardiac enzymes and obtain an echocardiogram and carotid Dopplers. We will attempt ultrasound-guided thoracentesis of her effusion. We will follow her sugars with Accu-Cheks before meals and at bedtime and add sliding scale insulin as needed. We will begin IV fluids for her relative hypotension and acute renal failure. Follow up routine labs in the morning. We will begin IV antibiotics for her cystitis and send off a urine culture at this time. Prognosis is poor. Further treatment and evaluation will depend upon the patient's progress.   TOTAL TIME SPENT ON THIS PATIENT:  50 minutes.    ____________________________ Leonie Douglas Doy Hutching, MD jds:jm D: 03/04/2013 17:13:35 ET T: 03/04/2013 17:30:56 ET JOB#: [id number]  cc: Leonie Douglas. Doy Hutching, MD, <Dictator> Fonnie Jarvis. Ilene Qua, MD Ulisses Vondrak Lennice Sites MD ELECTRONICALLY SIGNED 03/05/2013 13:02

## 2014-05-19 NOTE — Consult Note (Signed)
PATIENT NAME:  [name] Green, [name] Green MR#:  [id number] DATE OF BIRTH:  05/22/1925  DATE OF CONSULTATION:  03/05/2013  REFERRING PHYSICIAN:  Dr. Doy Hutching  CONSULTING PHYSICIAN:  Mayzie Caughlin R. Ma Hillock, MD  REASON FOR CONSULTATION: Metastatic lung cancer.   HISTORY OF PRESENT ILLNESS: The patient is an 81 year old female with past medical history significant for hypertension, hiatal hernia, questionable history of atrial fibrillation, atherosclerotic heart disease, status post PTCA with stent placement, status post MI, cataract surgery, history of colon cancer, status post colectomy. More recently found to have stage IV adenocarcinoma of the lung with bilateral lung metastases, and followed by Dr. Grayland Ormond. She was last seen by Dr. Grayland Ormond on January 21, at which time he felt that her performance status was slowly declining and that she was not a good candidate for palliative chemotherapy, and testing for EGFR was pursued to see if targeted therapy can be used. The patient states that she missed an appointment last week, since she was weak and sick. She is currently admitted to the hospital with complaints of syncope and progressive dyspnea on minimal activity/ambulation. Chest x-ray reveals a large left pleural effusion. She has just returned after getting ultrasound-guided thoracentesis done. Report states that she had 1.2 liters of amber-colored fluid removed. States that she is still feeling weak, and has some cough and dyspnea. Denies any severe pain issues. No fevers or chills. Denies hemoptysis.   PAST MEDICAL HISTORY/SURGICAL HISTORY: As in HPI, above.   FAMILY HISTORY: Remarkable for heart disease, stroke.   SOCIAL HISTORY: Denies alcohol usage. Ex-smoker, quit recently. Does not do much activity, mostly resting in chair or bed.   ALLERGIES: No known drug allergies.   HOME MEDICATIONS: 1.  Lopressor 50 mg b.i.d.  2.  Zofran 8 mg q. 8 hours p.r.n.  3.  Potassium chloride 20 mEq daily.  4.  Nitro-Dur  0.1 mg topical daily. 5.   Plavix 75 mg daily. 6.   Lasix 40 mg daily. 7.  Meclizine 25 mg t.i.d.  8.  Ferrous sulfate 325 mg daily. 9.  Flonase 2 puffs each nostril daily.  10.  Lipitor 20 mg daily. 11.  Aspirin 81 mg daily. 12.  Amiodarone 200 mg daily.  13.  Norco 5/325 mg 1 tablet q. 4 hours p.r.n. pain.   REVIEW OF SYSTEMS:  CONSTITUTIONAL: As in HPI. She is weak and dyspneic on exertion. No fever or chills.  HEENT: Denies any headaches or dizziness at rest. No epistaxis, ear or jaw pain.  CARDIAC: Denies any angina, palpitation, orthopnea, or PND.   LUNGS: As in HPI.   GASTROINTESTINAL: No nausea or vomiting. No diarrhea or blood in stools.  GENITOURINARY: No dysuria or hematuria.  SKIN: No new rashes or pruritus.  HEMATOLOGIC: Denies any obvious bleeding symptoms.  MUSCULOSKELETAL: No new bone pains. Has chronic low back pain.  EXTREMITIES: No new swelling or pain.  NEUROLOGIC: No new focal weakness or seizures. Denies any current headaches.  ENDOCRINE: No polyuria or polydipsia.   PHYSICAL EXAMINATION: GENERAL: The patient is elderly, frail-looking, resting in bed. Otherwise alert and oriented to self, place, person, and converses appropriately. No acute distress.  VITAL SIGNS: 98.2, 75, 16, 98/59, 91% on room air.  HEENT: Normocephalic, atraumatic. Extraocular movements intact. Sclerae anicteric. No oral thrush.  NECK: Negative for lymphadenopathy.  CARDIOVASCULAR: S1, S2, regular rate and rhythm.  LUNGS: Bilateral diminished breath sounds overall, more so in the left lower lobe area. No crepitations or rhonchi noted.  ABDOMEN: Soft, nontender. No  hepatomegaly.  EXTREMITIES: No major edema or cyanosis.  SKIN: No generalized rashes or major bruising.  NEUROLOGIC: Limited exam. Cranial nerves seem intact. Moves all extremities spontaneously.  MUSCULOSKELETAL: No obvious joint deformity or swelling.   LAB RESULTS: WBC 13,800, ANC 10,800, hemoglobin 7.6, platelets 347.  Creatinine 2.62, BUN 43, potassium 5.4, calcium 8.4. LFT unremarkable, except albumin of 1.7. INR 1.3.   Chest x-ray showed left large pleural effusion and left lower lobe atelectasis.   IMPRESSION AND RECOMMENDATIONS: An 81 year old female patient with history of multiple medical problems, as described above, with known history of advanced adenocarcinoma of the lung, with bilateral lung metastases, followed by Dr. Grayland Ormond. She is currently admitted with progressive dyspnea and syncope. Chest x-ray showed large left pleural effusion, and 1.2 liters of fluid has been tapped by ultrasound-guided thoracentesis today. The patient explained that most likely etiology for effusion would be her known history of lung cancer. Will request fluid to be sent for cell count and differential, culture and cytology for malignant cells. Also, will notify Dr. Grayland Ormond tomorrow to follow up on any molecular testing available on previous biopsy specimen, and to discuss with patient regarding any feasible treatment for lung cancer. She does have poor performance status otherwise. She does not have any major pain issues or ongoing hemoptysis at this time. Oncology will continue to follow.   Thank you for the referral. Please feel free to contact me for additional questions.   ____________________________ Rhett Bannister Ma Hillock, MD srp:mr D: 03/05/2013 01:74:94 ET T: 03/05/2013 20:38:24 ET JOB#: [id number]  cc: Trevor Iha R. Ma Hillock, MD, <Dictator> Alveta Heimlich MD ELECTRONICALLY SIGNED 03/10/2013 15:00

## 2014-05-20 NOTE — Consult Note (Signed)
Chief Complaint:   Subjective/Chief Complaint doing well, no bm since yesterday am.  no abdominal pain. tolerating clears.   VITAL SIGNS/ANCILLARY NOTES: **Vital Signs.:   24-Jun-13 13:13   Vital Signs Type Routine   Temperature Temperature (F) 98.3   Celsius 36.8   Temperature Source oral   Pulse Pulse 70   Pulse source per vital sign device   Respirations Respirations 20   Systolic BP Systolic BP 614   Diastolic BP (mmHg) Diastolic BP (mmHg) 43   Mean BP 74   BP Source vital sign device   Pulse Ox % Pulse Ox % 96   Pulse Ox Activity Level  At rest   Oxygen Delivery Room Air/ 21 %    13:44   Vital Signs Type Recheck   Systolic BP Systolic BP 431   Diastolic BP (mmHg) Diastolic BP (mmHg) 69   Mean BP 94   BP Source vital sign device   Brief Assessment:   Cardiac Regular    Respiratory clear BS    Gastrointestinal details normal Soft  Nontender  Nondistended  No masses palpable  Bowel sounds normal   Lab Results: Routine Hem:  22-Jun-13 09:15    Hemoglobin (CBC)  8.6    17:19    Hemoglobin (CBC)  8.4 (Result(s) reported on 18 Jul 2011 at 05:47PM.)  23-Jun-13 04:26    Hemoglobin (CBC)  7.6  24-Jun-13 04:58    Hemoglobin (CBC)  8.9 (Result(s) reported on 20 Jul 2011 at 05:55AM.)   Assessment/Plan:  Assessment/Plan:   Assessment 1) hematochezia in the setting of known h/o right colon resection ofor colon ca.  last colonoscopy 6/12  ok.  Also h/o diverticulosis, likely the source of theis bleeding;    Plan 1) advance to full liquids, then to low residue as tolerated.  if no problems can d/c later today or in am.  will need GI o/p fu in about 2-3 weeks.   Electronic Signatures: Loistine Simas (MD)  (Signed 24-Jun-13 15:35)  Authored: Chief Complaint, VITAL SIGNS/ANCILLARY NOTES, Brief Assessment, Lab Results, Assessment/Plan   Last Updated: 24-Jun-13 15:35 by Loistine Simas (MD)

## 2014-05-20 NOTE — Consult Note (Signed)
Brief Consult Note: Diagnosis: hematochezia.   Patient was seen by consultant.   Consult note dictated.   Recommend to proceed with surgery or procedure.   Recommend further assessment or treatment.   Orders entered.   Discussed with Attending MD.   Comments: Patient seen and examined, full consult dictated.  76 f presenting with maroon hematochezia.  no ab dominal pain, good appetite, on bid ppi.  GI bleeding scan ordered.  Clinical impression of lower gi bleed, and with h/o diverticulosis, likely diverticular.  Agree with ppi for prophy.  Awaiting result of bleeding scan.  If positive recommend vascular surgery consult for possible microembolization olf the bleeding lesion via angio.  Electronic Signatures: Loistine Simas (MD)  (Signed [phone] 16:38)  Authored: Brief Consult Note   Last Updated: 22-Jun-13 16:38 by Loistine Simas (MD)

## 2014-05-20 NOTE — H&P (Signed)
PATIENT NAME:  [name] Green, [name] Green MR#:  [id number] DATE OF BIRTH:  Jul 16, 1925  DATE OF ADMISSION:  07/18/2011  PRIMARY CARE PHYSICIAN: Dr. Domenick Gong  ED REFERRING PHYSICIAN: Dr. Renee Ramus  REASON FOR ADMISSION: GI bleed.   HISTORY OF PRESENT ILLNESS: Patient is an 81 year old Caucasian female with previous history of having colon cancer stage 2A, tumor size 3.2 x 3.7 x 0.5 cm invading through the muscularis propria. Status post right colon resection 08/22/2007 with no adjuvant chemotherapy who had a colonoscopy last year without any evidence of recurrence. She reports that recently she was started on Mobic beginning of the month by primary care provider for having pain in her legs. Since last night she started noticing bright red blood per rectum. She at least had four episodes last night and then since this morning she has had 2 to 3 episodes, one being in the ED with dark colored stool. She has not had any abdominal pain, nausea, vomiting, or diarrhea. She denies any recent history of having weight loss. She has not been feeling dizzy. No shortness of breath. She reports that she was feeling normal up until yesterday. Just feels a little weak today. Denies any other complaints of urinary frequency, urgency, or burning. Denies any chest pains or shortness of breath.   PAST MEDICAL HISTORY:  1. History of colon cancer status post resection 08/22/2007 without any further evidence of recurrence.  2. Hypertension.  3. Previous history of gastritis.  4. Likely has osteoarthritis based on chronic joint pains.   PAST SURGICAL HISTORY:  1. Status post partial colectomy.  2. Tonsillectomy.  3. Cataract surgery.   ALLERGIES: She is not allergic to any medications.    CURRENT MEDICATIONS:  1. Aspirin 81 mg 1 tab p.o. daily.  2. Iron sulfate 325 p.o. daily.  3. Meloxicam 7.5 daily with meals.  4. Metoprolol tartrate 25, 1 tab p.o. b.i.d.  5. Omeprazole 20, 1 tab p.o. b.i.d.   SOCIAL HISTORY:  Nonsmoker. No alcohol. No drug use. Currently resides with her son and daughter-in-law.   REVIEW OF SYSTEMS: CONSTITUTIONAL: Denies any fevers, fatigue. Just mild weakness. Does have pain in her legs. No weight loss. No weight gain. EYES: No blurred vision or double vision. No pain. No redness. No inflammation. Does have cataracts. ENT: No tinnitus. No ear pain. No hearing loss. No seasonal or year-round allergies. No difficulty swallowing. RESPIRATORY: No cough. No wheezing. No hemoptysis. No chronic obstructive pulmonary disease. No tuberculosis, pneumonia. CARDIOVASCULAR: No chest pain. No orthopnea. No edema. No arrhythmia. Does have history of high blood pressure. GASTROINTESTINAL: No nausea, vomiting. No abdominal pain. Has a history of gastroesophageal reflux disease. No irritable bowel syndrome. No jaundice. No changes in bowel habits. GENITOURINARY: Denies any dysuria, hematuria, renal calculus, frequency. ENDO: Denies any polyuria, nocturia, thyroid problems. No increase in sweating, heat or cold intolerance. HEME/LYMPH: Has chronic iron deficiency anemia. Has not required any recent transfusions. Denies any easy bruisability, bleeding. SKIN: No acne. No rash. No changes in mole, hair or skin. MUSCULOSKELETAL: Has chronic pain in her legs and certain other joints, likely related to osteoarthritis. No gout. NEUROLOGIC: No numbness. No cerebrovascular accident. No transient ischemic attack. No seizures. PSYCHIATRIC: No anxiety. No insomnia. No ADD.   PHYSICAL EXAMINATION:  VITAL SIGNS: Temperature 97.7, pulse 76, respirations 20, blood pressure 139/59.   GENERAL: Patient is a well developed, well nourished Caucasian female in no acute distress.   HEENT: Head atraumatic, normocephalic. Pupils equally round, reactive to light and accommodation.  Extraocular movements intact. No scleral icterus. There is conjunctival pallor. Extraocular movements intact. Nasal exam shows no drainage or ulceration.  Oropharynx is clear without any exudates.   NECK: No thyromegaly. No carotid bruits.   CARDIOVASCULAR: Regular rate and rhythm. No murmurs, rubs, clicks, or gallops. PMI is not displaced.   LUNGS: Clear to auscultation bilaterally without any rales, rhonchi, wheezing.   ABDOMEN: Soft, nontender, nondistended. Positive bowel sounds x4. There is no hepatosplenomegaly.   EXTREMITIES: No clubbing, cyanosis, edema.   SKIN: No rash.   RECTAL: Rectal exam done by the ED physician, he stated that there was dark colored stool appreciated.   NEUROLOGICAL: Awake, alert, oriented x3. No focal deficits.   SKIN: No rash.   LYMPHATICS: No lymph nodes palpable.   VASCULAR: Good DP, PT pulses.   LABORATORY, DIAGNOSTIC AND RADIOLOGICAL DATA: Glucose 106, BUN 21, creatinine 1.10, sodium 143, potassium 4.3, chloride 110, CO2 24, lipase 157. LFTs: Total protein 6.4, albumin 3.0, bilirubin total 0.3, alkaline phosphatase 68, AST 20, ALT 22. WBC 6.1, hemoglobin 8.6, platelet count 170. INR 1.1. CT scan of the abdomen and pelvis shows minimal basilar atelectasis, cholelithiasis, hepatic renal cyst. No findings suggestive of bowel obstruction. Scattered colonic diverticulosis noted without evidence of acute diverticulitis.   ASSESSMENT AND PLAN: Patient is a 81 year old with history of colon cancer status post resection 2009 with no evidence of recurrence presents with bright red blood followed by dark colored stools today.  1. Gastrointestinal bleed, likely lower but has had dark colored stools since this morning. Raises a possibility of upper GI bleed. I have discussed the case with Dr. Gustavo Lah who agrees to get a bleeding scan in light of the diverticula noted on the CT scan. If she does have active bleeding will get vascular surgery to evaluate the patient for embolectomy. Currently, her hemoglobin is 8.6 but is likely to drift downwards. I will repeat another hemoglobin in three hours. Transfuse her if it  drops below eight or has another active bleed and then after that will check her hemoglobin q.6 if she is stable. Currently she is hemodynamically stable with blood pressure being normal as well as heart rate being normal.  2. Hypertension. Currently blood pressure is stable. As above. Will hold metoprolol in light of her active GI bleed.  3. Gastroesophageal reflux disease. She will be on Protonix drip.  4. Osteoarthritis. Recently started on meloxicam. Will not be able to use this, will do Tylenol p.r.n.  5. Miscellaneous. Patient is ambulatory. Does not at this point need any deep vein thrombosis prophylaxis in light of her bleeding.   TIME SPENT: 45 minutes spent.   Case discussed with Dr. Gustavo Lah. He will be seeing the patient later.   ____________________________ Lafonda Mosses. Posey Pronto, MD shp:cms D: 07/18/2011 14:24:06 ET T: 07/18/2011 14:36:24 ET JOB#: [id number]  cc: Karime Scheuermann H. Posey Pronto, MD, <Dictator> Fonnie Jarvis. Ilene Qua, MD  Alric Seton MD ELECTRONICALLY SIGNED 07/20/2011 14:52

## 2014-05-20 NOTE — Discharge Summary (Signed)
PATIENT NAME:  [name] Green, [name] Green MR#:  [id number] DATE OF BIRTH:  07-20-1925  DATE OF ADMISSION:  07/18/2011 DATE OF DISCHARGE:  07/20/2011  ADMISSION DIAGNOSIS: Gastrointestinal bleed.  DISCHARGE DIAGNOSES:  1. Gastrointestinal bleed, most likely diverticular.  2. Acute on chronic iron deficiency anemia.  3. Hypertension.  4. Osteoarthritis.   CONSULTANTS: Loistine Simas, MD  LABS: Discharge hemoglobin 8.9.   HOSPITAL COURSE: This is an 81 year old female with a history of colon cancer, in remission, who presented with lower GI bleed. For further details, please refer to the History and Physical.  1. GI bleed, likely diverticular. The patient has history of colon cancer, in remission. She had a Hemoccult positive stool. Colonoscopy in June 2012 showed evidence of right hemicolectomy from previous colon cancer surgery and diverticulosis. EGD in March 2012 showed hiatal hernia. Appreciate gastroenterology consultation. She was treated supportively, continued on PPI, and avoided NSAIDs and meloxicam. GI bleed scan was negative for occult bleed. She has no further evidence of GI bleed. 2. Anemia, acute GI blood loss with iron deficiency anemia, status post 1 unit of packed red blood cells. Her hemoglobin remained stable. She will continue with ferrous sulfate.  3. Hypertension. Increased metoprolol for better blood pressure control.  4. Osteoarthritis, on Tylenol.   DISCHARGE MEDICATIONS: The patient's prescriptions were electronically submitted. 1. Aspirin 81 mg daily.  2. Ferrous sulfate 325 mg daily.  3. Metoprolol 50 mg every 12 hours.  4. Pantoprazole 40 mg daily.  5. Tylenol 325 mg 2 tablets every four hours p.r.n.   DISCHARGE INSTRUCTIONS/FOLLOWUP: The patient will followup in 1 to 2 days with Dr. Ilene Qua for hemoglobin and in 2 weeks with Dr. Gustavo Lah.  TIME SPENT: 35 minutes. ____________________________ Donell Beers. Benjie Karvonen, MD spm:slb D: 07/20/2011 12:39:02 ET T: 07/20/2011 13:03:06  ET JOB#: [id number]  cc: Lota Leamer P. Benjie Karvonen, MD, <Dictator> Fonnie Jarvis. Ilene Qua, MD Lollie Sails, MD Donell Beers Abegail Kloeppel MD ELECTRONICALLY SIGNED 07/20/2011 13:15

## 2014-05-20 NOTE — Consult Note (Signed)
PATIENT NAME:  [name] Green, [name] Green MR#:  [id number] DATE OF BIRTH:  [birth year]  DATE OF CONSULTATION:  07/18/2011  REFERRING PHYSICIAN:  Dr. Dustin Flock  CONSULTING PHYSICIAN:  Lollie Sails, MD  REASON FOR CONSULTATION: Hematochezia.   HISTORY OF PRESENT ILLNESS: [name] Green is an 81 year old Caucasian female with whom I am familiar. I have seen her in the past in regards to colonoscopy and EGD for various problems. She has a history of a Hemoccult-positive stool of uncertain etiology as well as iron deficiency anemia. She had a colonoscopy last in June 2012, this showing evidence of her right hemicolectomy for previous colon cancer surgery as well as diverticulosis. She last had an EGD in March 2012 at that time evaluation for her anemia showing only a hiatal hernia. She had been doing fairly well, however, had a awakened this morning at about midnight to 1:00, had to go to the bathroom and passed some blood. This was brighter red initially and has been fading to a darker red over the course of the day. She had several episodes before the morning and went to her daughter's house whereupon she was brought to the Emergency Room. She denies any problems with nausea, vomiting, or abdominal pain. She states she usually has a bowel movement about twice a day. She does not usually see any black stools, blood in the stool, or slimy stools. There is no history of heartburn or dysphagia. She does take iron 1 tablet daily. Her appetite has been good. She has had no weight loss. She was started, however, recently on some Mobic. She has had a CT scan while in the Emergency Room showing some cholelithiasis, hepatic and renal cysts. No evidence of bowel wall obstruction, thickening, acute infectious or inflammatory process. She was noted to have scattered colonic diverticula without evidence of acute diverticulitis.   PAST MEDICAL HISTORY:  1. Hypertension.  2. History of gastritis in the past.   3. Osteoarthritis.  4. She has had a colon cancer diagnosed in the summer of 2009 with resection 08/22/2007. Last colonoscopy without evidence of recurrence as noted above.  5. History of tonsillectomy. 6. Cataract surgery.   ALLERGIES: She has no known drug allergies.   CURRENT MEDICATIONS:  1. Aspirin 81 mg daily.  2. Ferrous sulfate 325 mg once a day. 3. Meloxicam 7.5 mg daily. 4. Metoprolol 25 mg 1 tablet twice a day.  5. Omeprazole 20 mg 1 capsule twice a day.   REVIEW OF SYSTEMS: Per admission history and physical.   PHYSICAL EXAMINATION:  VITAL SIGNS: Temperature 97.7, pulse 76, respirations 20, blood pressure 139/59.   GENERAL: She is an 81 year old Caucasian female in no acute distress.   HEENT: Normocephalic, atraumatic. Eyes: Anicteric. Nose: Septum midline. No lesions. Oropharynx: No lesions.   NECK: Supple. No JVD.   HEART: Regular rate and rhythm.   LUNGS: Clear.   ABDOMEN: Soft, nontender, nondistended. Bowel sounds positive, normoactive.   RECTAL: Anorectal examination shows a watery medium maroon stool/effluent. There is no formed stool.   EXTREMITIES: No clubbing, cyanosis, or edema.   NEUROLOGICAL: Cranial nerves II through XII grossly intact. Muscle strength bilaterally equal and symmetric, 5/5. Deep tendon reflexes bilaterally equal and symmetric.   LABORATORY, DIAGNOSTIC, AND RADIOLOGICAL DATA: On admission to the hospital she had a glucose of 106, BUN 21, creatinine 1.10, sodium 143, potassium 4.3, chloride 110, bicarbonate 24, calcium 8.2, lipase 157. Hepatic profile normal except albumin being slightly low at 3.0. Hemogram showing white count of  6.1, hemoglobin and hematocrit 8.6/25.7, platelet count 170, MCV 84, INR 1.1. She has been typed and screened. Urinalysis was 1+ positive for blood, negative otherwise. She has had a cardiopulmonary lactic acid 0.7, normal. She had a CT scan of the abdomen and pelvis as noted above.   ASSESSMENT: Hematochezia  in the setting of known history of previous partial colon resection and diverticulosis. Most likely this is a diverticular/lower GI bleed. Although patient has started on some meloxicam recently, she has no abdominal pain, she has had no emesis or upper GI symptoms and she is also on a proton pump inhibitor twice a day. It is much less likely this would be from an upper source.   RECOMMENDATIONS:  1. Serial hemoglobins.  2. Transfuse as needed.  3. I have talked with Dr. Jeneen Rinks in radiology. We will proceed with a GI bleeding study. If this is a positive scan we will get vascular surgery involved to see whether or not there is any possibility of microembolization. If she continues to bleed while in the ER I would certainly change her admission status to Intensive Care Unit. Will follow with you.  ____________________________ Lollie Sails, MD mus:cms D: 07/18/2011 16:32:20 ET T: 07/19/2011 07:26:30 ET JOB#: [id number]  cc: Lollie Sails, MD, <Dictator>  Lollie Sails MD ELECTRONICALLY SIGNED 07/22/2011 12:07

## 2014-05-20 NOTE — Consult Note (Signed)
Chief Complaint:   Subjective/Chief Complaint last bm about 7 hours ago, denies abd pain or nausea.   VITAL SIGNS/ANCILLARY NOTES: **Vital Signs.:   23-Jun-13 15:40   Vital Signs Type Blood Transfusion Complete   Temperature Temperature (F) 98   Celsius 36.6   Temperature Source oral   Pulse Pulse 69   Respirations Respirations 16   Systolic BP Systolic BP 341   Diastolic BP (mmHg) Diastolic BP (mmHg) 71   Mean BP 109   Pulse Ox % Pulse Ox % 97   Oxygen Delivery Room Air/ 21 %  *Intake and Output.:   23-Jun-13 05:29   Stool  dark maroon liquid stool    09:17   Stool  WATREY BLOODY   Brief Assessment:   Cardiac Regular    Respiratory clear BS    Gastrointestinal details normal Soft  Nontender  Nondistended  No masses palpable  Bowel sounds normal   Lab Results:  Routine Chem:  23-Jun-13 04:26    Glucose, Serum 74   BUN 13   Creatinine (comp) 0.98   Sodium, Serum 145   Potassium, Serum 4.0   Chloride, Serum  115   CO2, Serum 25   Calcium (Total), Serum  7.8   Anion Gap  5   Osmolality (calc) 287   eGFR (African American) >60   eGFR (Non-African American)  53 (eGFR values <42m/min/1.73 m2 may be an indication of chronic kidney disease (CKD). Calculated eGFR is useful in patients with stable renal function. The eGFR calculation will not be reliable in acutely ill patients when serum creatinine is changing rapidly. It is not useful in  patients on dialysis. The eGFR calculation may not be applicable to patients at the low and high extremes of body sizes, pregnant women, and vegetarians.)  Routine Hem:  23-Jun-13 04:26    WBC (CBC) 4.7   RBC (CBC)  2.74   Hemoglobin (CBC)  7.6   Hematocrit (CBC)  23.2   Platelet Count (CBC) 154   MCV 85   MCH 27.6   MCHC 32.5   RDW 13.5   Neutrophil % 53.6   Lymphocyte % 33.1   Monocyte % 10.4   Eosinophil % 2.4   Basophil % 0.5   Neutrophil # 2.5   Lymphocyte # 1.6   Monocyte # 0.5   Eosinophil # 0.1   Basophil #  0.0 (Result(s) reported on 19 Jul 2011 at 06:01AM.)   Assessment/Plan:  Assessment/Plan:   Assessment 1) hematochezia-lower GI bleeding, likely diverticular in etiology.  decreasing amount.  now s/p 1 unit tfx prbc.  stable. bleeding scan yesterday negative.  2) h/o colon ca/resection.    Plan 1) continue current/observation.  no immediate plans for luminal evaluation. following.   Electronic Signatures: SLoistine Simas(MD)  (Signed 23-Jun-13 16:18)  Authored: Chief Complaint, VITAL SIGNS/ANCILLARY NOTES, Brief Assessment, Lab Results, Assessment/Plan   Last Updated: 23-Jun-13 16:18 by SLoistine Simas(MD)

## 2014-06-18 IMAGING — CT CT ASPIRATION
2 series · 22 of 32 positions shown, 29 images · non-contrast
Comparison: PET-CT 01/25/2013 .

CLINICAL DATA: Lung mass.  Multiple PET positive lesions.

EXAM:
CT ARTHROGRAPHY OF THE
TECHNIQUE: Multidetector CT imaging was performed following the standard
protocol after injection of dilute contrast into the joint.

[Series 2: routine chest wo · axial · 0.68mm/px · z∈[-645,-595]mm · 5 of 20 slices shown]
[im 2/20  soft-tissue]
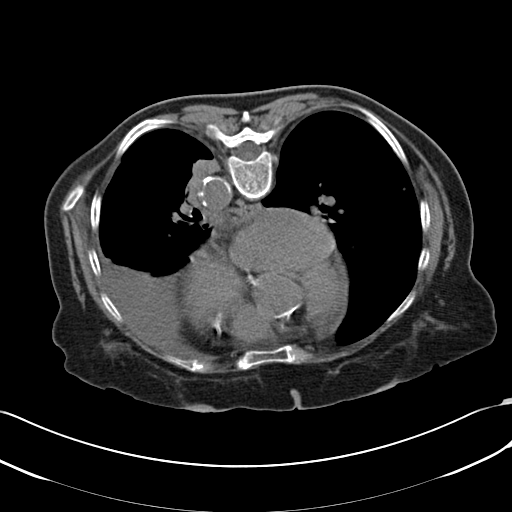
[im 5/20  soft-tissue]
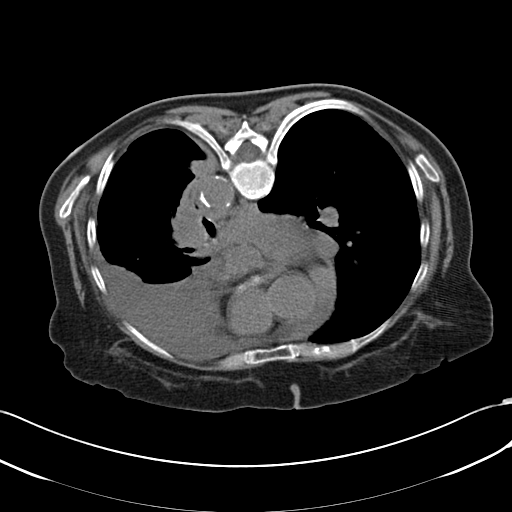
[im 7/20  soft-tissue]
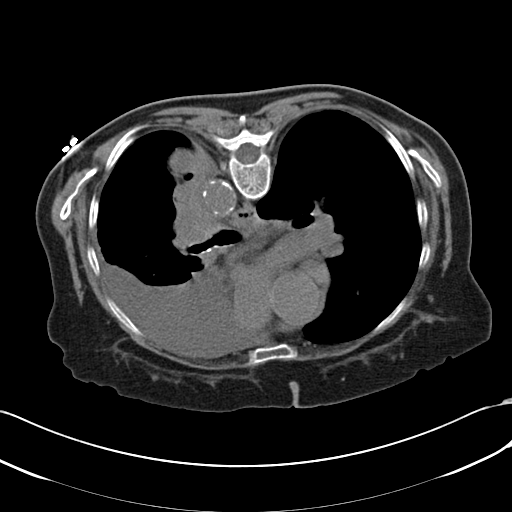
[im 8/20  soft-tissue]
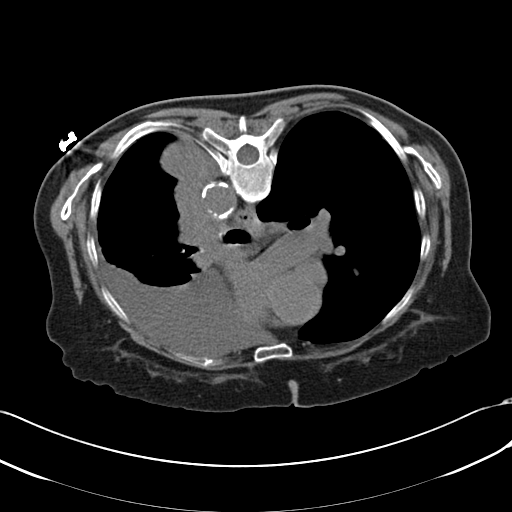
[im 12/20  soft-tissue]
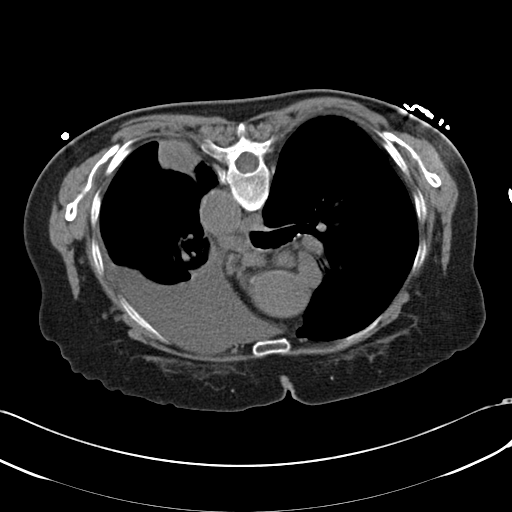

[Series 3: (hospital) 4.8 b30s · axial · 1.48mm/px · z∈[-603,-367]mm · 17 of 28 slices shown, 24 images]
[im 2/28  soft-tissue]
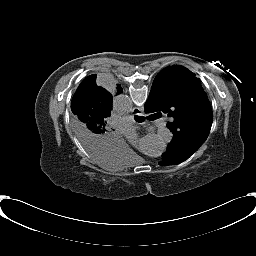
[im 2/28  bone]
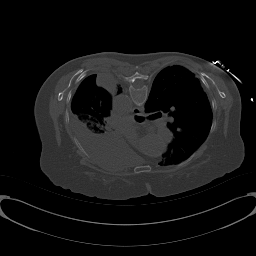
[im 4/28  soft-tissue]
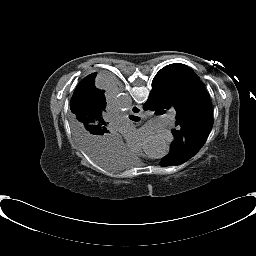
[im 5/28  soft-tissue]
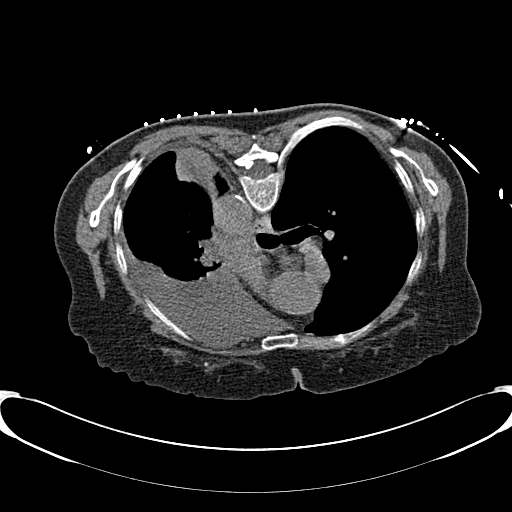
[im 7/28  soft-tissue]
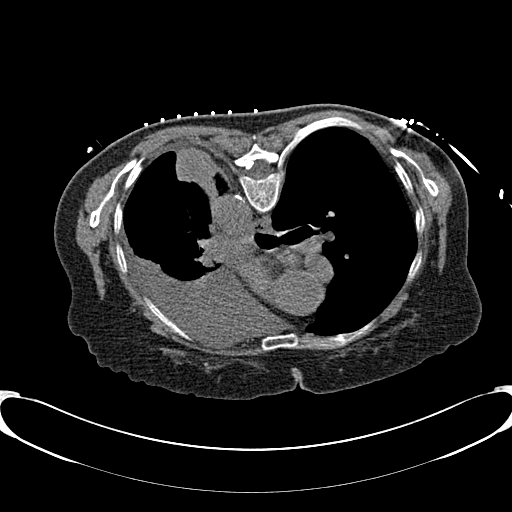
[im 8/28  soft-tissue]
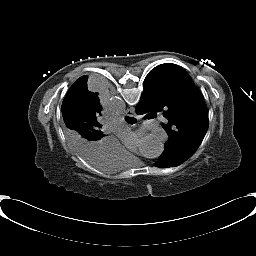
[im 10/28  soft-tissue]
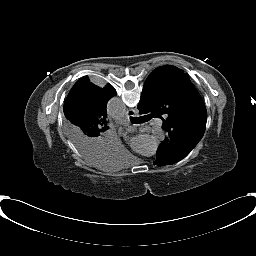
[im 10/28  lung]
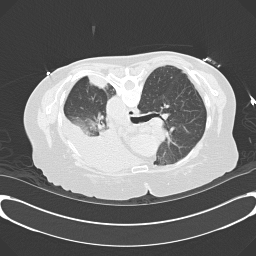
[im 11/28  soft-tissue]
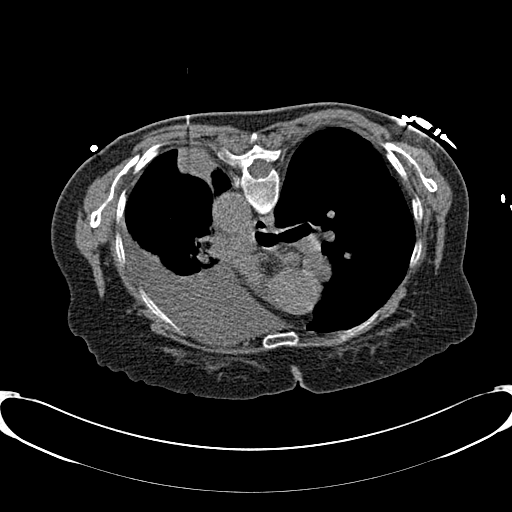
[im 11/28  lung]
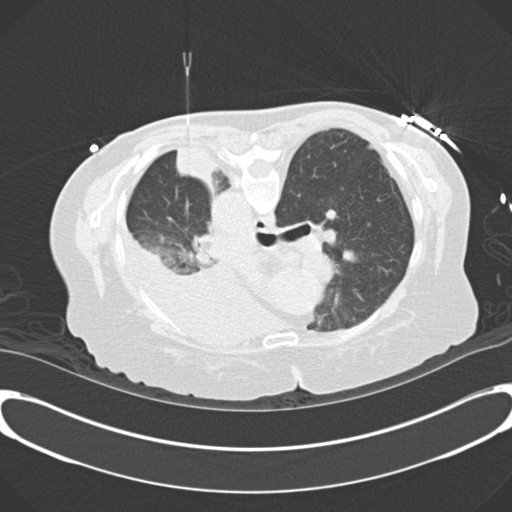
[im 13/28  soft-tissue]
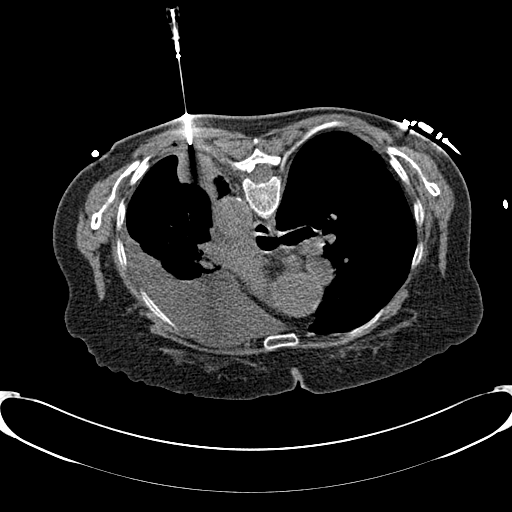
[im 14/28  soft-tissue]
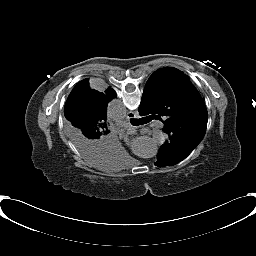
[im 14/28  bone]
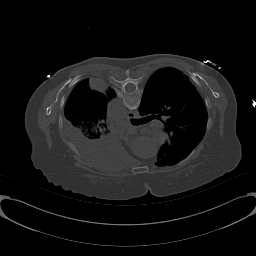
[im 16/28  soft-tissue]
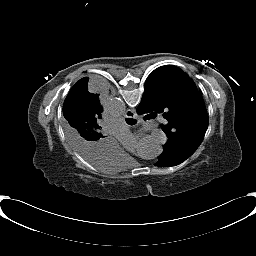
[im 17/28  soft-tissue]
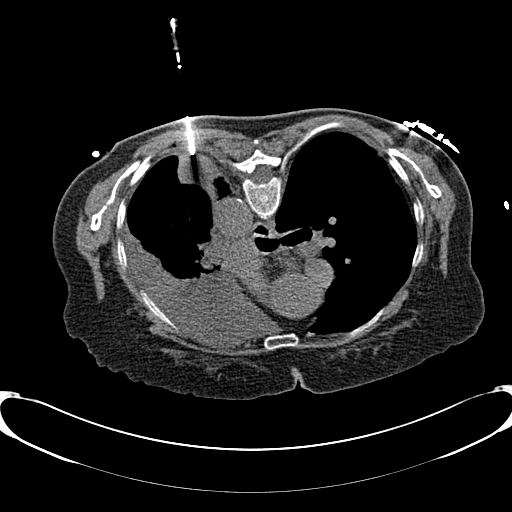
[im 19/28  soft-tissue]
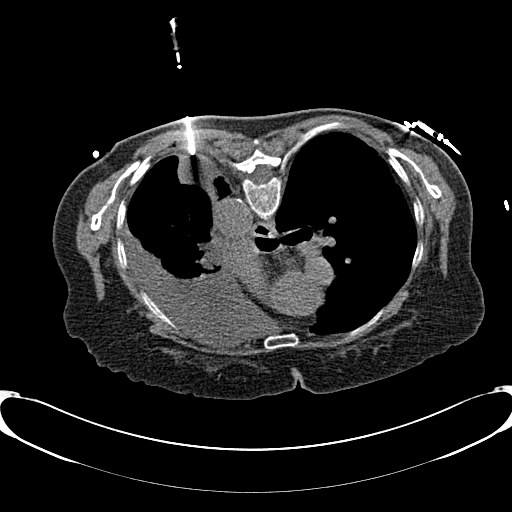
[im 20/28  soft-tissue]
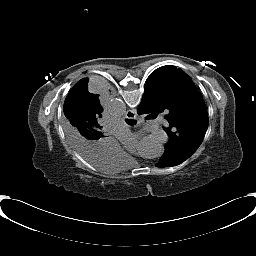
[im 22/28  soft-tissue]
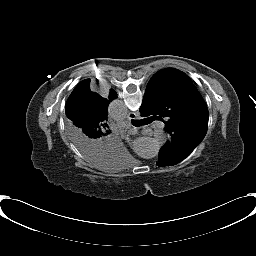
[im 23/28  soft-tissue]
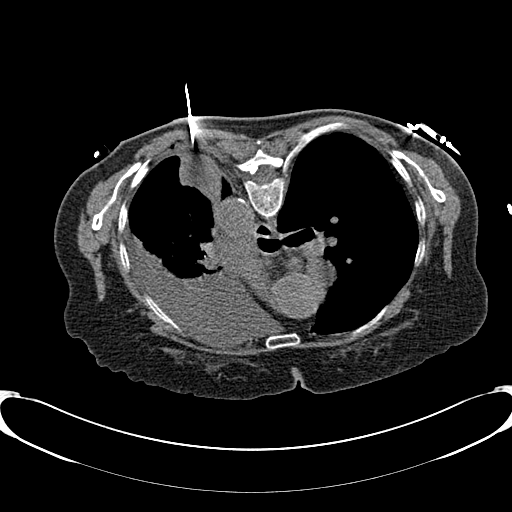
[im 25/28  soft-tissue]
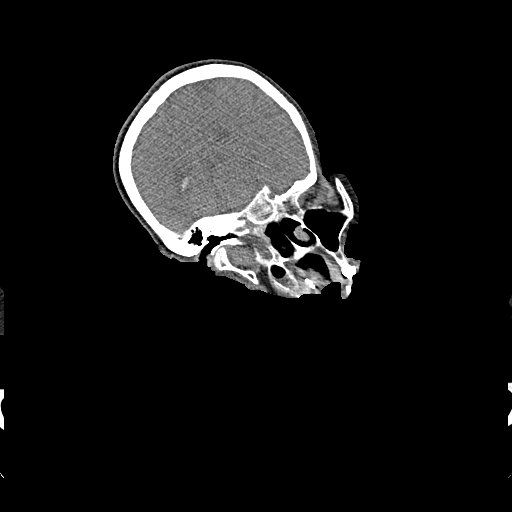
[im 25/28  lung]
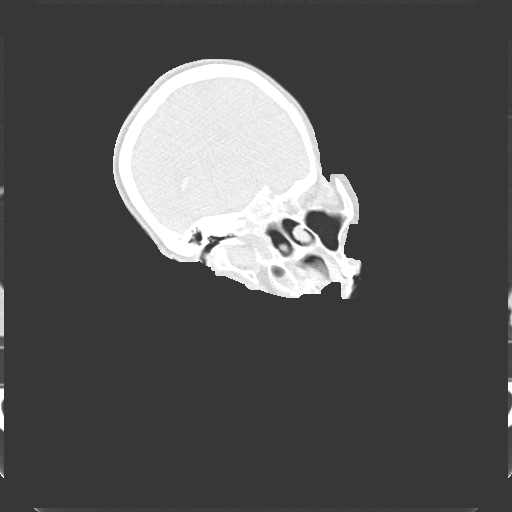
[im 26/28  soft-tissue]
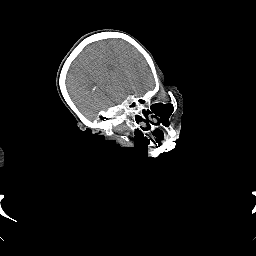
[im 26/28  lung]
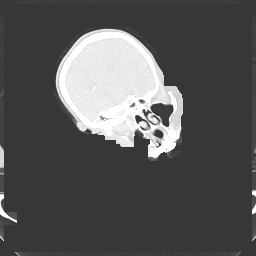
[im 26/28  bone]
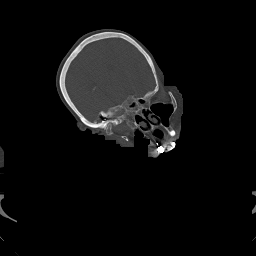

[22 of 32 positions shown; findings below may reference images not displayed]

FINDINGS: Following ultrasound of the supraclavicular region of the left upper
chest and lower neck, it was decided to approach the PET positive
left lung lesion as the small supraclavicular lymph nodes would have
been difficult to assess due to their proximity to the to the
subclavian artery. After discussing the risks and benefits of this
procedure the patient informed consent obtained. The back was
sterilely prepped and draped. Following local anesthesia with 1%
lidocaine a 18 gauge BioPence needle system was advanced into the
left lung mass and a core biopsy obtained. Preliminary
cytopathologic results are positive.
IMPRESSION: Successful lung mass biopsy with preliminary cytopathologic results
positive for malignancy . No complications.

## 2014-06-18 IMAGING — CR DG CHEST 1V PORT
1 series · 1 of 1 positions shown · non-contrast
Comparison: Chest x-ray same date.  PET-CT 01/13/2013.

CLINICAL DATA: Left lung mass biopsy.

EXAM:
PORTABLE CHEST - 1 VIEW

[ap]
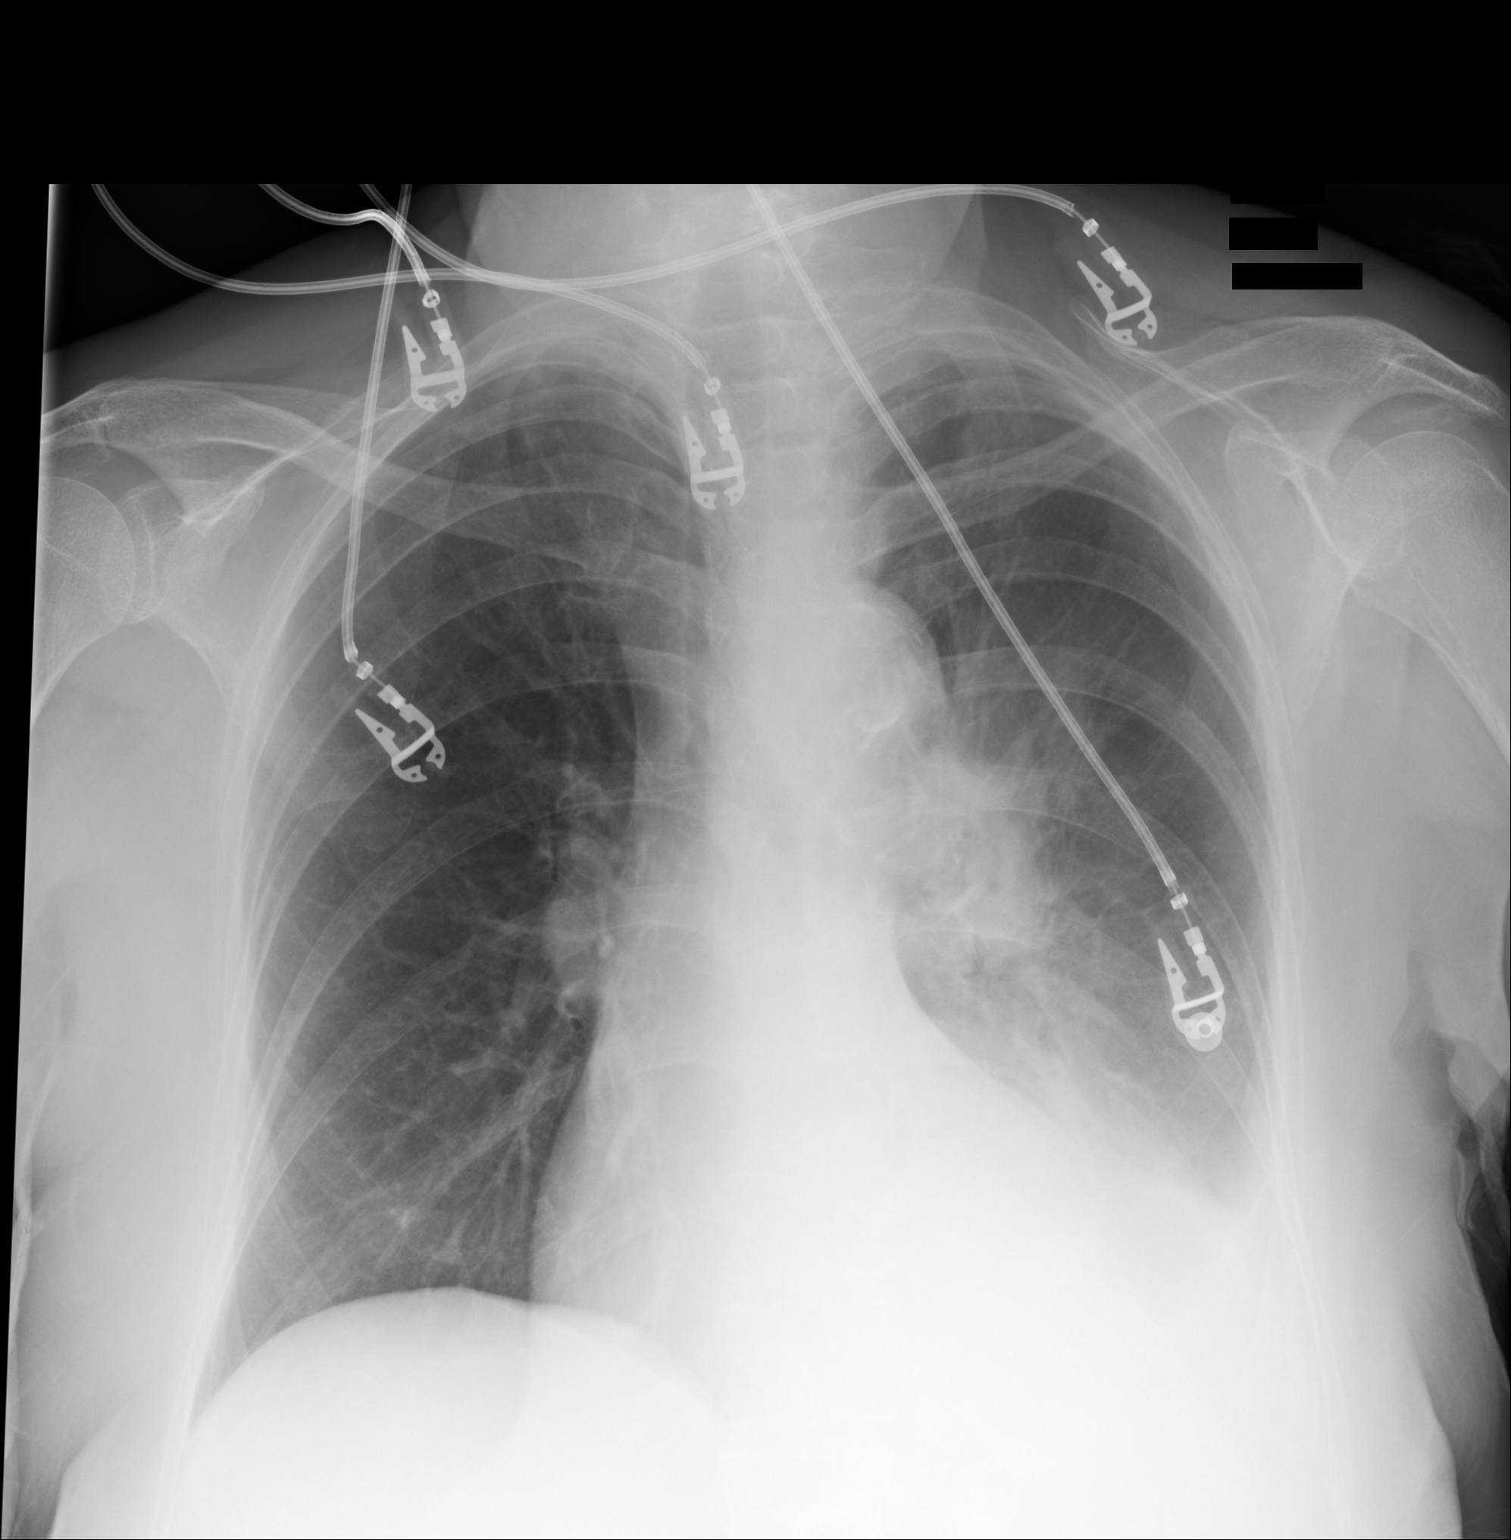

[1 of 1 positions shown; findings below may reference images not displayed]

FINDINGS: Persistent left hilar fullness and left lower lobe atelectasis
and/or infiltrate with left pleural effusion remain. No evidence of
pneumothorax post biopsy. Heart size stable.
IMPRESSION: No evidence of pneumothorax 2 hr post biopsy left lung mass.

## 2014-07-14 IMAGING — CR DG CHEST POST BIOPSY - THORACENTESIS
1 series · 1 of 1 positions shown · non-contrast
Comparison: 03/04/2013

CLINICAL DATA: Post left thoracentesis.

EXAM:
CHEST XRAY 1 VIEW

[x chest ap]
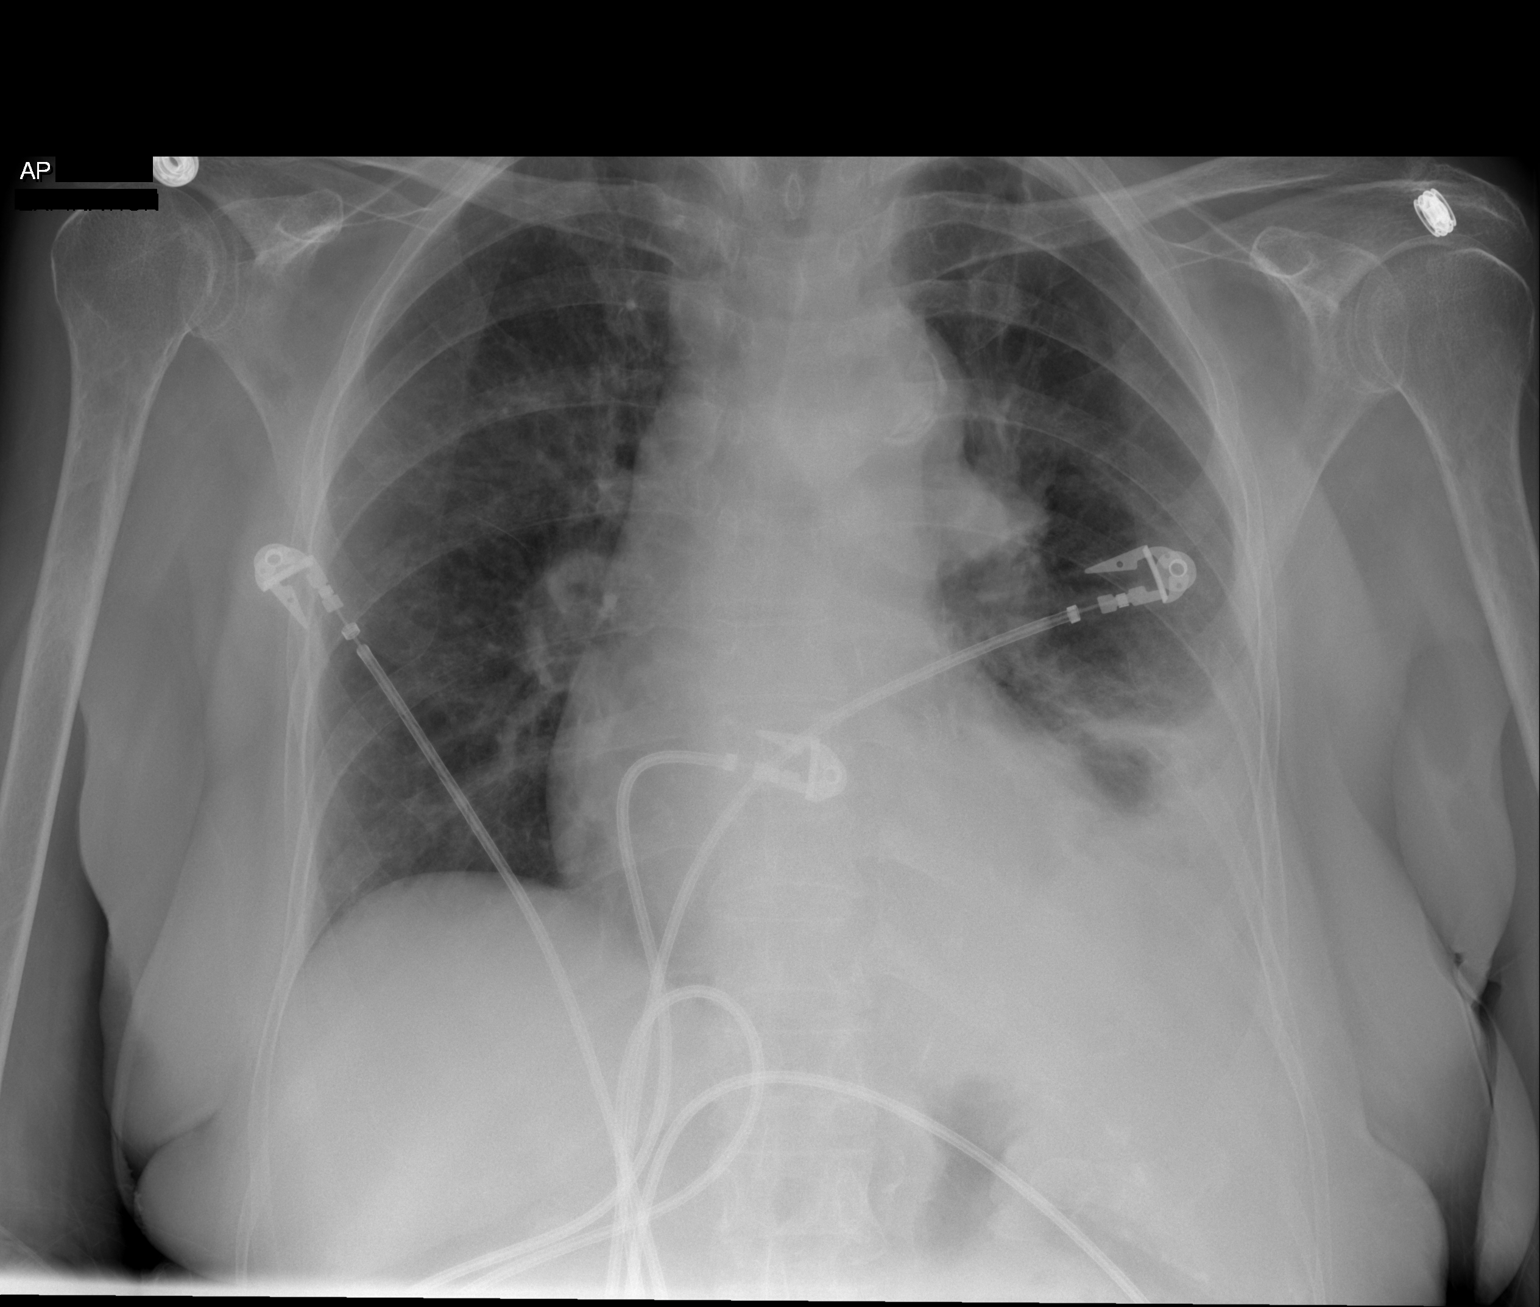

[1 of 1 positions shown; findings below may reference images not displayed]

FINDINGS: The left pleural effusion has markedly decreased in size. There
continues to be a small to moderate amount of left pleural fluid
with atelectasis. There is no evidence for a left pneumothorax.
Heart size is within normal limits. No focal disease in the right
lung. Thoracic aorta is heavily calcified.
IMPRESSION: Decreased left pleural effusion. Residual pleural fluid and
atelectasis at the left lung base. No evidence for a pneumothorax.
